# Patient Record
Sex: Male | Born: 1970 | Race: White | Hispanic: No | Marital: Single | Smoking: Current every day smoker
Health system: Southern US, Community
[De-identification: ages and names within clinical notes are randomized; demographics above are authoritative.]

## PROBLEM LIST (undated history)

## (undated) DIAGNOSIS — F17200 Nicotine dependence, unspecified, uncomplicated: Secondary | ICD-10-CM

## (undated) DIAGNOSIS — C4492 Squamous cell carcinoma of skin, unspecified: Secondary | ICD-10-CM

## (undated) DIAGNOSIS — F1020 Alcohol dependence, uncomplicated: Secondary | ICD-10-CM

## (undated) DIAGNOSIS — I2699 Other pulmonary embolism without acute cor pulmonale: Secondary | ICD-10-CM

## (undated) DIAGNOSIS — T148XXA Other injury of unspecified body region, initial encounter: Secondary | ICD-10-CM

## (undated) DIAGNOSIS — I4892 Unspecified atrial flutter: Secondary | ICD-10-CM

---

## 2015-11-05 DIAGNOSIS — C4492 Squamous cell carcinoma of skin, unspecified: Secondary | ICD-10-CM

## 2015-11-05 HISTORY — PX: SKIN BIOPSY: SHX1

## 2015-11-05 HISTORY — DX: Squamous cell carcinoma of skin, unspecified: C44.92

## 2016-05-20 ENCOUNTER — Emergency Department (HOSPITAL_COMMUNITY): Payer: BLUE CROSS/BLUE SHIELD

## 2016-05-20 ENCOUNTER — Encounter (HOSPITAL_COMMUNITY): Payer: Self-pay

## 2016-05-20 ENCOUNTER — Inpatient Hospital Stay (HOSPITAL_COMMUNITY)
Admission: EM | Admit: 2016-05-20 | Discharge: 2016-05-26 | DRG: 176 | Disposition: A | Discharge status: Home / Self Care | Payer: BLUE CROSS/BLUE SHIELD | Attending: Internal Medicine | Admitting: Internal Medicine

## 2016-05-20 DIAGNOSIS — A419 Sepsis, unspecified organism: Secondary | ICD-10-CM | POA: Diagnosis present

## 2016-05-20 DIAGNOSIS — B9789 Other viral agents as the cause of diseases classified elsewhere: Secondary | ICD-10-CM | POA: Diagnosis present

## 2016-05-20 DIAGNOSIS — E873 Alkalosis: Secondary | ICD-10-CM | POA: Diagnosis present

## 2016-05-20 DIAGNOSIS — R000 Tachycardia, unspecified: Secondary | ICD-10-CM | POA: Diagnosis not present

## 2016-05-20 DIAGNOSIS — I313 Pericardial effusion (noninflammatory): Secondary | ICD-10-CM

## 2016-05-20 DIAGNOSIS — I5042 Chronic combined systolic (congestive) and diastolic (congestive) heart failure: Secondary | ICD-10-CM | POA: Diagnosis present

## 2016-05-20 DIAGNOSIS — I2699 Other pulmonary embolism without acute cor pulmonale: Secondary | ICD-10-CM | POA: Diagnosis present

## 2016-05-20 DIAGNOSIS — I420 Dilated cardiomyopathy: Secondary | ICD-10-CM

## 2016-05-20 DIAGNOSIS — F1029 Alcohol dependence with unspecified alcohol-induced disorder: Secondary | ICD-10-CM | POA: Diagnosis not present

## 2016-05-20 DIAGNOSIS — I301 Infective pericarditis: Secondary | ICD-10-CM | POA: Diagnosis present

## 2016-05-20 DIAGNOSIS — F17200 Nicotine dependence, unspecified, uncomplicated: Secondary | ICD-10-CM | POA: Diagnosis present

## 2016-05-20 DIAGNOSIS — Z85828 Personal history of other malignant neoplasm of skin: Secondary | ICD-10-CM

## 2016-05-20 DIAGNOSIS — I483 Typical atrial flutter: Secondary | ICD-10-CM | POA: Diagnosis not present

## 2016-05-20 DIAGNOSIS — I4891 Unspecified atrial fibrillation: Secondary | ICD-10-CM | POA: Diagnosis present

## 2016-05-20 DIAGNOSIS — F1090 Alcohol use, unspecified, uncomplicated: Secondary | ICD-10-CM | POA: Diagnosis present

## 2016-05-20 DIAGNOSIS — I3139 Other pericardial effusion (noninflammatory): Secondary | ICD-10-CM | POA: Diagnosis present

## 2016-05-20 DIAGNOSIS — F1020 Alcohol dependence, uncomplicated: Secondary | ICD-10-CM | POA: Diagnosis present

## 2016-05-20 DIAGNOSIS — Z86711 Personal history of pulmonary embolism: Secondary | ICD-10-CM | POA: Diagnosis not present

## 2016-05-20 DIAGNOSIS — K5900 Constipation, unspecified: Secondary | ICD-10-CM | POA: Diagnosis present

## 2016-05-20 DIAGNOSIS — I4892 Unspecified atrial flutter: Secondary | ICD-10-CM | POA: Diagnosis present

## 2016-05-20 DIAGNOSIS — Z8249 Family history of ischemic heart disease and other diseases of the circulatory system: Secondary | ICD-10-CM | POA: Diagnosis not present

## 2016-05-20 DIAGNOSIS — I319 Disease of pericardium, unspecified: Secondary | ICD-10-CM | POA: Diagnosis not present

## 2016-05-20 DIAGNOSIS — I2609 Other pulmonary embolism with acute cor pulmonale: Secondary | ICD-10-CM | POA: Diagnosis not present

## 2016-05-20 HISTORY — DX: Alcohol dependence, uncomplicated: F10.20

## 2016-05-20 HISTORY — DX: Unspecified atrial flutter: I48.92

## 2016-05-20 HISTORY — DX: Nicotine dependence, unspecified, uncomplicated: F17.200

## 2016-05-20 HISTORY — DX: Other injury of unspecified body region, initial encounter: T14.8XXA

## 2016-05-20 HISTORY — DX: Squamous cell carcinoma of skin, unspecified: C44.92

## 2016-05-20 LAB — CBC WITH DIFFERENTIAL/PLATELET
BASOS ABS: 0 10*3/uL (ref 0.0–0.1)
Basophils Relative: 0 %
EOS ABS: 0.2 10*3/uL (ref 0.0–0.7)
Eosinophils Relative: 1 %
HCT: 39.5 % (ref 39.0–52.0)
Hemoglobin: 13.1 g/dL (ref 13.0–17.0)
LYMPHS ABS: 2 10*3/uL (ref 0.7–4.0)
Lymphocytes Relative: 10 %
MCH: 30.1 pg (ref 26.0–34.0)
MCHC: 33.2 g/dL (ref 30.0–36.0)
MCV: 90.8 fL (ref 78.0–100.0)
MONO ABS: 2 10*3/uL — AB (ref 0.1–1.0)
Monocytes Relative: 10 %
Neutro Abs: 16.2 10*3/uL — ABNORMAL HIGH (ref 1.7–7.7)
Neutrophils Relative %: 79 %
PLATELETS: 390 10*3/uL (ref 150–400)
RBC: 4.35 MIL/uL (ref 4.22–5.81)
RDW: 14.2 % (ref 11.5–15.5)
WBC: 20.4 10*3/uL — AB (ref 4.0–10.5)

## 2016-05-20 LAB — COMPREHENSIVE METABOLIC PANEL
ALBUMIN: 3.2 g/dL — AB (ref 3.5–5.0)
ALT: 25 U/L (ref 17–63)
AST: 37 U/L (ref 15–41)
Alkaline Phosphatase: 108 U/L (ref 38–126)
Anion gap: 10 (ref 5–15)
BUN: 14 mg/dL (ref 6–20)
CHLORIDE: 102 mmol/L (ref 101–111)
CO2: 23 mmol/L (ref 22–32)
Calcium: 8.8 mg/dL — ABNORMAL LOW (ref 8.9–10.3)
Creatinine, Ser: 0.97 mg/dL (ref 0.61–1.24)
GFR calc Af Amer: >60 mL/min (ref >60)
GLUCOSE: 138 mg/dL — AB (ref 65–99)
POTASSIUM: 3.9 mmol/L (ref 3.5–5.1)
Sodium: 135 mmol/L (ref 135–145)
Total Bilirubin: 0.7 mg/dL (ref 0.3–1.2)
Total Protein: 6.8 g/dL (ref 6.5–8.1)

## 2016-05-20 LAB — LACTIC ACID, PLASMA: LACTIC ACID, VENOUS: 1.1 mmol/L (ref 0.5–1.9)

## 2016-05-20 LAB — I-STAT CG4 LACTIC ACID, ED: Lactic Acid, Venous: 2.42 mmol/L (ref 0.5–1.9)

## 2016-05-20 LAB — APTT: aPTT: 51 seconds — ABNORMAL HIGH (ref 24–36)

## 2016-05-20 LAB — PROTIME-INR
INR: 1.4
Prothrombin Time: 17.2 seconds — ABNORMAL HIGH (ref 11.4–15.2)

## 2016-05-20 LAB — BLOOD GAS, ARTERIAL
Acid-base deficit: 2.7 mmol/L — ABNORMAL HIGH (ref 0.0–2.0)
Bicarbonate: 20.6 mmol/L (ref 20.0–28.0)
Drawn by: 42624
O2 Content: 2 L/min
O2 Saturation: 95.2 %
PCO2 ART: 29.2 mmHg — AB (ref 32.0–48.0)
PH ART: 7.461 — AB (ref 7.350–7.450)
Patient temperature: 98.6
pO2, Arterial: 78.9 mmHg — ABNORMAL LOW (ref 83.0–108.0)

## 2016-05-20 LAB — CBG MONITORING, ED: Glucose-Capillary: 154 mg/dL — ABNORMAL HIGH (ref 65–99)

## 2016-05-20 LAB — BRAIN NATRIURETIC PEPTIDE: B NATRIURETIC PEPTIDE 5: 104.7 pg/mL — AB (ref 0.0–100.0)

## 2016-05-20 LAB — ETHANOL

## 2016-05-20 LAB — PROCALCITONIN: PROCALCITONIN: 0.21 ng/mL

## 2016-05-20 LAB — T4, FREE: Free T4: 1.02 ng/dL (ref 0.61–1.12)

## 2016-05-20 LAB — MRSA PCR SCREENING: MRSA by PCR: NEGATIVE

## 2016-05-20 LAB — I-STAT TROPONIN, ED: TROPONIN I, POC: 0.01 ng/mL (ref 0.00–0.08)

## 2016-05-20 LAB — TSH: TSH: 2.007 u[IU]/mL (ref 0.350–4.500)

## 2016-05-20 LAB — HEPARIN LEVEL (UNFRACTIONATED): Heparin Unfractionated: <0.1 IU/mL — ABNORMAL LOW (ref 0.30–0.70)

## 2016-05-20 MED ORDER — FENTANYL CITRATE (PF) 100 MCG/2ML IJ SOLN
50.0000 ug | Freq: Once | INTRAMUSCULAR | Status: AC
  Administered 2016-05-20: 50 ug via INTRAVENOUS
  Filled 2016-05-20: qty 2

## 2016-05-20 MED ORDER — LORAZEPAM 2 MG/ML IJ SOLN
0.0000 mg | Freq: Four times a day (QID) | INTRAMUSCULAR | Status: DC
  Administered 2016-05-20: 1 mg via INTRAVENOUS
  Filled 2016-05-20: qty 1

## 2016-05-20 MED ORDER — HEPARIN (PORCINE) IN NACL 100-0.45 UNIT/ML-% IJ SOLN
2500.0000 [IU]/h | INTRAMUSCULAR | Status: AC
  Administered 2016-05-20: 1200 [IU]/h via INTRAVENOUS
  Administered 2016-05-21: 1500 [IU]/h via INTRAVENOUS
  Administered 2016-05-21: 2000 [IU]/h via INTRAVENOUS
  Administered 2016-05-22: 2250 [IU]/h via INTRAVENOUS
  Administered 2016-05-22: 2300 [IU]/h via INTRAVENOUS
  Administered 2016-05-23: 2450 [IU]/h via INTRAVENOUS
  Administered 2016-05-23 – 2016-05-25 (×5): 2500 [IU]/h via INTRAVENOUS
  Filled 2016-05-20 (×11): qty 250

## 2016-05-20 MED ORDER — VANCOMYCIN HCL IN DEXTROSE 1-5 GM/200ML-% IV SOLN
1000.0000 mg | Freq: Once | INTRAVENOUS | Status: AC
  Administered 2016-05-20: 1000 mg via INTRAVENOUS
  Filled 2016-05-20: qty 200

## 2016-05-20 MED ORDER — VITAMIN B-1 100 MG PO TABS
100.0000 mg | ORAL_TABLET | Freq: Every day | ORAL | Status: DC
  Administered 2016-05-20 – 2016-05-25 (×5): 100 mg via ORAL
  Filled 2016-05-20 (×5): qty 1

## 2016-05-20 MED ORDER — SODIUM CHLORIDE 0.9 % IV BOLUS (SEPSIS)
1000.0000 mL | Freq: Once | INTRAVENOUS | Status: AC
  Administered 2016-05-20: 1000 mL via INTRAVENOUS

## 2016-05-20 MED ORDER — ONDANSETRON HCL 4 MG/2ML IJ SOLN: 4.0000 mg | Freq: Four times a day (QID) | INTRAMUSCULAR | Status: DC | PRN

## 2016-05-20 MED ORDER — LORAZEPAM 1 MG PO TABS
1.0000 mg | ORAL_TABLET | Freq: Four times a day (QID) | ORAL | Status: AC | PRN
  Administered 2016-05-22: 1 mg via ORAL
  Filled 2016-05-20: qty 1

## 2016-05-20 MED ORDER — PIPERACILLIN-TAZOBACTAM 3.375 G IVPB 30 MIN: 3.3750 g | Freq: Once | INTRAVENOUS | Status: DC

## 2016-05-20 MED ORDER — ADULT MULTIVITAMIN W/MINERALS CH
1.0000 | ORAL_TABLET | Freq: Every day | ORAL | Status: DC
  Administered 2016-05-21 – 2016-05-25 (×5): 1 via ORAL
  Filled 2016-05-20 (×5): qty 1

## 2016-05-20 MED ORDER — LORAZEPAM 2 MG/ML IJ SOLN: 0.0000 mg | Freq: Two times a day (BID) | INTRAMUSCULAR | Status: DC

## 2016-05-20 MED ORDER — PIPERACILLIN-TAZOBACTAM 3.375 G IVPB
3.3750 g | Freq: Three times a day (TID) | INTRAVENOUS | Status: DC
  Administered 2016-05-20 – 2016-05-23 (×8): 3.375 g via INTRAVENOUS
  Filled 2016-05-20 (×9): qty 50

## 2016-05-20 MED ORDER — ORAL CARE MOUTH RINSE
15.0000 mL | Freq: Two times a day (BID) | OROMUCOSAL | Status: DC
  Administered 2016-05-21 – 2016-05-26 (×7): 15 mL via OROMUCOSAL

## 2016-05-20 MED ORDER — VANCOMYCIN HCL IN DEXTROSE 1-5 GM/200ML-% IV SOLN: 1000.0000 mg | Freq: Once | INTRAVENOUS | Status: DC

## 2016-05-20 MED ORDER — VANCOMYCIN HCL IN DEXTROSE 1-5 GM/200ML-% IV SOLN
1000.0000 mg | Freq: Three times a day (TID) | INTRAVENOUS | Status: DC
  Administered 2016-05-20 – 2016-05-22 (×5): 1000 mg via INTRAVENOUS
  Filled 2016-05-20 (×6): qty 200

## 2016-05-20 MED ORDER — MORPHINE SULFATE (PF) 4 MG/ML IV SOLN
2.0000 mg | INTRAVENOUS | Status: DC | PRN
  Filled 2016-05-20: qty 1

## 2016-05-20 MED ORDER — THIAMINE HCL 100 MG/ML IJ SOLN
100.0000 mg | Freq: Every day | INTRAMUSCULAR | Status: DC
  Administered 2016-05-21: 100 mg via INTRAVENOUS
  Filled 2016-05-20 (×2): qty 2

## 2016-05-20 MED ORDER — ONDANSETRON HCL 4 MG PO TABS: 4.0000 mg | ORAL_TABLET | Freq: Four times a day (QID) | ORAL | Status: DC | PRN

## 2016-05-20 MED ORDER — PIPERACILLIN-TAZOBACTAM 3.375 G IVPB 30 MIN
3.3750 g | Freq: Once | INTRAVENOUS | Status: AC
  Administered 2016-05-20: 3.375 g via INTRAVENOUS
  Filled 2016-05-20: qty 50

## 2016-05-20 MED ORDER — SODIUM CHLORIDE 0.9 % IV BOLUS (SEPSIS)
2000.0000 mL | Freq: Once | INTRAVENOUS | Status: AC
  Administered 2016-05-20: 2000 mL via INTRAVENOUS

## 2016-05-20 MED ORDER — FOLIC ACID 1 MG PO TABS
1.0000 mg | ORAL_TABLET | Freq: Every day | ORAL | Status: DC
  Administered 2016-05-20 – 2016-05-25 (×6): 1 mg via ORAL
  Filled 2016-05-20 (×6): qty 1

## 2016-05-20 MED ORDER — LORAZEPAM 2 MG/ML IJ SOLN: 1.0000 mg | Freq: Four times a day (QID) | INTRAMUSCULAR | Status: AC | PRN

## 2016-05-20 MED ORDER — SODIUM CHLORIDE 0.9 % IV SOLN
INTRAVENOUS | Status: AC
  Administered 2016-05-20: 21:00:00 via INTRAVENOUS

## 2016-05-20 MED ORDER — LORAZEPAM 2 MG/ML IJ SOLN
0.5000 mg | Freq: Once | INTRAMUSCULAR | Status: AC
  Administered 2016-05-20: 0.5 mg via INTRAVENOUS
  Filled 2016-05-20: qty 1

## 2016-05-20 MED ORDER — IOPAMIDOL (ISOVUE-370) INJECTION 76%
INTRAVENOUS | Status: AC
  Administered 2016-05-20: 70 mL
  Filled 2016-05-20: qty 100

## 2016-05-20 MED ORDER — HEPARIN BOLUS VIA INFUSION
4000.0000 [IU] | Freq: Once | INTRAVENOUS | Status: AC
  Administered 2016-05-20: 4000 [IU] via INTRAVENOUS
  Filled 2016-05-20: qty 4000

## 2016-05-20 MED ORDER — SODIUM CHLORIDE 0.9% FLUSH
3.0000 mL | Freq: Two times a day (BID) | INTRAVENOUS | Status: DC
  Administered 2016-05-21 – 2016-05-26 (×4): 3 mL via INTRAVENOUS

## 2016-05-20 MED ORDER — ACETAMINOPHEN 500 MG PO TABS
1000.0000 mg | ORAL_TABLET | Freq: Four times a day (QID) | ORAL | Status: DC | PRN
  Administered 2016-05-20: 1000 mg via ORAL
  Filled 2016-05-20: qty 2

## 2016-05-20 NOTE — H&P (Signed)
History and Physical    Carl Bush [id number] DOB: 17-Feb-1971 DOA: 05/20/2016  PCP: [facility] - first appointment scheduled for tomorrow Consultants:  Dermatology - Melina Copa Patient coming from: home - lives alone; [place]: mother, [phone]  Chief Complaint: chest pain, SOB  HPI: Carl Bush is a 45 y.o. male with no significant past medical history presenting with chest pain, SOB.    Since September, he has noticed upper chest pain with any exertion.  Light-headed.  SOB.  Occasional leg spasms.  Pain in upper chest and down below rib cage.  Uncertain what might have brought it on - initially attributed it to the rabies series, which he finished on 7/28 after having been bitten by a rabid raccoon.  Pain is constant, SOB is also constant.  Last night, his legs spasmed and he got dizzy, light-headed, chest pain, SOB - had to drop onto the kitchen floor to recover. No weight changes.  Fever to 100.5 in ER, thinks he also has been febrile at home for especially the last 2 nights.  Slight cough, nonproductive.  +rhinorrhea typical for this time of year.   Thinks he has a problem with ETOH.  20+ year history, drinks 12 pack QOD.  Longest period of sobriety 6 months.  No h/o withdrawal/DTs.  Does not desire to quit drinking.   ED Course: Per Dr. Vanita Panda: Patient account of new atrial fibrillation versus atrial flutter with persistent heart rate in the 120/160 range.  I discussed the patient's case with our critical care colleagues given concern for new A. fib, as well as pulmonary embolism, with some evidence for concurrent infection.  Patient is mentating appropriately, and in spite of his persistent tachycardia, does not need emergent airway, he is deemed appropriate for a stepdown bed.  I discussed the patient's findings thus far with him, including PE, pericardial effusion, concern for infection, new arrhythmia.  Patient has received broad-spectrum in a box, will receive heparin  drip.  Patient awaiting admission to stepdown bed.   Review of Systems: As per HPI; otherwise 10 point review of systems reviewed and negative.   Ambulatory Status:  Dizzy with standing, pain starts, difficulty ambulating  Past Medical History:  Diagnosis Date  . Alcohol dependence ([place])   . 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 Tobacco dependence     Past Surgical History:  Procedure Laterality Date  . SKIN BIOPSY  11/2015    Social History   Social History  . Marital status: Single    Spouse name: N/A  . Number of children: N/A  . Years of education: N/A   Occupational History  . landlord    Social History Main Topics  . Smoking status: Current Every Day Smoker    Packs/day: 1.00    Years: 24.00  . Smokeless tobacco: Never Used  . Alcohol use 25.2 oz/week    42 Cans of beer per week     Comment: 12 pack every other day  . Drug use:     Types: Marijuana     Comment: daily marijuana  . Sexual activity: Not on file   Other Topics Concern  . Not on file   Social History Narrative  . No narrative on file    No Known Allergies  History reviewed. No pertinent family history.  Prior to Admission medications   Medication Sig Start Date End Date Taking? Authorizing Provider  ibuprofen (ADVIL,MOTRIN) 200 MG tablet Take 200-400 mg by mouth every 6 (six)  hours as needed for headache (or pain).   Yes Historical Provider, MD    Physical Exam: Vitals:   05/20/16 2315 05/20/16 2330 05/21/16 0000 05/21/16 0100  BP: 120/84 (!) 128/93 97/74 113/79  Pulse: (!) 130 (!) 130 (!) 130 (!) 128  Resp: (!) 32 (!) 29 (!) 27 (!) 30  Temp:  (!) 100.9 F (38.3 C)    TempSrc:  Oral    SpO2: 97% 98% 97% 95%  Weight:      Height:         General:  Appears ill, +tachypnea with mildly increased WOB Eyes:  PERRL, EOMI, normal lids, iris ENT:  grossly normal hearing, lips & tongue, mmm Neck:  no LAD, masses or thyromegaly Cardiovascular:  Tachycardia  no m/r/g. No  LE edema.  Respiratory:  CTA bilaterally, no w/r/r. Increased WOB, tachypnea. Abdomen:  soft, ntnd, NABS Skin: no rash or induration seen on limited exam Musculoskeletal:  grossly normal tone BUE/BLE, good ROM, no bony abnormality Psychiatric:  grossly normal mood and affect, speech fluent and appropriate, AOx3 Neurologic:  CN 2-12 grossly intact, moves all extremities in coordinated fashion, sensation intact  Labs on Admission: I have personally reviewed following labs and imaging studies  CBC:  Recent Labs Lab 05/20/16 1450  WBC 20.4*  NEUTROABS 16.2*  HGB 13.1  HCT 39.5  MCV 90.8  PLT [id number]   Basic Metabolic Panel:  Recent Labs Lab 05/20/16 1450  NA 135  K 3.9  CL 102  CO2 23  GLUCOSE 138*  BUN 14  CREATININE 0.97  CALCIUM 8.8*   GFR: Estimated Creatinine Clearance: 105.3 mL/min (by C-G formula based on SCr of 0.97 mg/dL). Liver Function Tests:  Recent Labs Lab 05/20/16 1450  AST 37  ALT 25  ALKPHOS 108  BILITOT 0.7  PROT 6.8  ALBUMIN 3.2*   No results for input(s): LIPASE, AMYLASE in the last 168 hours. No results for input(s): AMMONIA in the last 168 hours. Coagulation Profile:  Recent Labs Lab 05/20/16 2100  INR 1.40   Cardiac Enzymes: No results for input(s): CKTOTAL, CKMB, CKMBINDEX, TROPONINI in the last 168 hours. BNP (last 3 results) No results for input(s): PROBNP in the last 8760 hours. HbA1C: No results for input(s): HGBA1C in the last 72 hours. CBG:  Recent Labs Lab 05/20/16 1423  GLUCAP 154*   Lipid Profile: No results for input(s): CHOL, HDL, LDLCALC, TRIG, CHOLHDL, LDLDIRECT in the last 72 hours. Thyroid Function Tests:  Recent Labs  05/20/16 1450  TSH 2.007  FREET4 1.02   Anemia Panel: No results for input(s): VITAMINB12, FOLATE, FERRITIN, TIBC, IRON, RETICCTPCT in the last 72 hours. Urine analysis: No results found for: COLORURINE, APPEARANCEUR, LABSPEC, PHURINE, GLUCOSEU, HGBUR, BILIRUBINUR, KETONESUR, PROTEINUR,  UROBILINOGEN, NITRITE, LEUKOCYTESUR  Creatinine Clearance: Estimated Creatinine Clearance: 105.3 mL/min (by C-G formula based on SCr of 0.97 mg/dL).  Sepsis Labs: @LABRCNTIP (procalcitonin:4,lacticidven:4) ) Recent Results (from the past 240 hour(s))  MRSA PCR Screening     Status: None   Collection Time: 05/20/16  8:44 PM  Result Value Ref Range Status   MRSA by PCR NEGATIVE NEGATIVE Final    Comment:        The GeneXpert MRSA Assay (FDA approved for NASAL specimens only), is one component of a comprehensive MRSA colonization surveillance program. It is not intended to diagnose MRSA infection nor to guide or monitor treatment for MRSA infections.      Radiological Exams on Admission: Ct Angio Chest Pe W Or Wo Contrast  Result Date: 05/20/2016 CLINICAL DATA:  45 y/o M; upper chest pain and shortness of breath. History of smoking. EXAM: CT ANGIOGRAPHY CHEST WITH CONTRAST TECHNIQUE: Multidetector CT imaging of the chest was performed using the standard protocol during bolus administration of intravenous contrast. Multiplanar CT image reconstructions and MIPs were obtained to evaluate the vascular anatomy. CONTRAST:  70 cc Isovue 370 COMPARISON:  None. FINDINGS: Cardiovascular: Satisfactory opacification of pulmonary arteries to the segmental level. Multiple segmental pulmonary arteries of the left lung base are occluded consistent with pulmonary embolus. No evidence for right heart strain. Normal heart size. Moderate pericardial effusion. Mild coronary artery calcifications. Normal caliber main pulmonary artery. Normal caliber thoracic aorta. Mediastinum/Nodes: Prominent subcentimeter mediastinal lymph nodes are likely reactive. Thyroid gland, trachea, and esophagus demonstrate no significant findings. Lungs/Pleura: Mild centrilobular emphysema. Ill-defined ground-glass opacity at the left lung base in region of multiple occluded segmental pulmonary emboli. Small left-sided pleural  effusion. Upper Abdomen: Gastrohepatic ligament lymphadenopathy of uncertain significance. Possible periportal edema. Musculoskeletal: No chest wall abnormality. No acute or significant osseous findings. Review of the MIP images confirms the above findings. IMPRESSION: 1. Left lower lobe segmental pulmonary emboli. 2. Ground-glass opacity in the left lower lobe in region of pulmonary emboli is probably a developing pulmonary infarct, less likely pneumonia. 3. Small left-sided pleural effusion. 4. Moderate pericardial effusion. 5. Gastrohepatic ligament lymphadenopathy and possible periportal edema in the liver of uncertain significance, partially visualized. These results were called by telephone at the time of interpretation on 05/20/2016 at 4:31 pm to Dr. Vanita Panda, who verbally acknowledged these results. Electronically Signed   By: Kristine Garbe M.D.   On: 05/20/2016 16:35   Dg Chest Port 1 View  Result Date: 05/20/2016 CLINICAL DATA:  Increasing chest pain and shortness of breath, fluttering in chest over last couple weeks, smoker EXAM: PORTABLE CHEST 1 VIEW COMPARISON:  Portable exam 1501 hours without priors for comparison. FINDINGS: Minimal enlargement of cardiac silhouette. Mediastinal contours and pulmonary vascularity normal. Suspicion of retrocardiac LEFT lower lobe infiltrate with loss of the LEFT diaphragmatic silhouette. Remaining lungs clear. No pleural effusion or pneumothorax. Bones unremarkable. IMPRESSION: Minimal enlargement of cardiac silhouette. Question LEFT lower lobe consolidation/pneumonia. Electronically Signed   By: Lavonia Dana M.D.   On: 05/20/2016 15:19    EKG: Independently reviewed.  Sinus tachycardia vs. Atrial flutter with rate 132; nonspecific ST changes  Assessment/Plan Principal Problem:   Pulmonary embolism (HCC) Active Problems:   Sepsis ([place])   Tobacco use disorder   Alcohol dependence ([place])   Pulmonary embolism -Patient's symptoms may all come  from this issue - has PE with apparent necrosis, moderate effusion -PCCM consulted and suggested hospitalist admission to SDU -ABG checked to ensure adequate ventilation/oxygenation; does not require intubation at this time -Heparin drip per pharmacy -Morphine as needed for pain -Likely to need cardiology consult in AM for pericardial effusion as well as possible atrial flutter - but for now hemodynamically stable and tachycardia may improve with hydration and treatment.  Sepsis -While his fever, tachycardia, tachypnea, elevated WBC count, elevated lactate are likely all related to PE, these would also describe sepsis physiology -Sepsis protocol initiated -Blood and urine cultures pending -Will admit to SDU with telemetry and continue to monitor -Treat with IV Vanc and Zosyn, pharmacy to dose -Will add HIV -Will trend lactate to ensure improvement  Tobacco dependence -Encourage cessation.  This was discussed with the patient and should be reviewed on an ongoing basis.   -Patch ordered at patient request.  Alcohol dependence -Acknowledges problem but does not desire to quit at this time. -Will place on CIWA protocol. -Ongoing counseling is encouraged.  DVT prophylaxis: Heparin drip Code Status:  Full - confirmed with patient Family Communication: None present  Disposition Plan: Home once clinically improved Consults called: PCCM (by ER)  Admission status: Admit - It is my clinical opinion that admission to INPATIENT is reasonable and necessary because this patient will require at least 2 midnights in the hospital to treat this condition based on the medical complexity of the problems presented.  Given the aforementioned information, the predictability of an adverse outcome is felt to be significant.    Karmen Bongo MD [facility]  If 7PM-7AM, please contact night-coverage [email or web address] Password [email or web address]  05/21/2016, 1:12 AM

## 2016-05-20 NOTE — ED Notes (Signed)
Pt's CBG 154.  Informed Loma Sousa, RN and Rachel Bo, Therapist, sports.

## 2016-05-20 NOTE — Progress Notes (Signed)
ANTICOAGULATION CONSULT NOTE - Initial Consult  Pharmacy Consult for heparin Indication: pulmonary embolus  No Known Allergies  Patient Measurements: Height: 6\' 2"  (188 cm) (per pt report) Weight: 167 lb (75.8 kg) (per pt report) IBW/kg (Calculated) : 82.2 Heparin Dosing Weight: 75.8 kg  Vital Signs: Temp: 100.5 F (38.1 C) (11/14 1426) Temp Source: Oral (11/14 1426) BP: 98/82 (11/14 1700) Pulse Rate: 149 (11/14 1700)  Labs:  Recent Labs  05/20/16 1450  HGB 13.1  HCT 39.5  PLT 390  CREATININE 0.97    Estimated Creatinine Clearance: 105.3 mL/min (by C-G formula based on SCr of 0.97 mg/dL).   Medical History: History reviewed. No pertinent past medical history.  Assessment: 45 yo M in ED with PE by CT scan.  Pharmacy consulted to dose heparin. 11/14 CT: left lower lobe segmental PE, ground-glass opacity on LL lobe in region of PE is probably a developing PE, less likely PNA. Moderate pericardial effusion, gastrohepatic ligament lymphadenopathy and possible periportal edema in liver. He reports his height is 6 ft 2 inches and his weight is 167 pounds. Creatinine WNL and CBC WNL.    Goal of Therapy:  Heparin level 0.3-0.7 units/ml Monitor platelets by anticoagulation protocol: Yes   Plan: heparin 4000 unit bolus Heparin drip at 1200 units/hr Check 6 hr heparin level Daily heparin level and CBC while on heparin F/u for oral anticoagulation  Eudelia Bunch, Pharm.D. BP:[phone] 05/20/2016 5:26 PM

## 2016-05-20 NOTE — ED Provider Notes (Signed)
[facility] DEPT Provider Note   CSN: [id number] Arrival date & time: 05/20/16  1414     History   Chief Complaint Chief Complaint  Patient presents with  . Tachycardia    HPI Carl Bush is a 45 y.o. male.  HPI Patient presents with shortness of breath and chest pain that started today. States he's had increasing dyspnea with exertion and fatigue. Has had mild cough without sputum production. Also admits to palpitations. No new lower extremity swelling but does have bilateral lower extremity cramps that started last night. Patient states he's had episodic chest pain and shortness of breath for the past 2 months. Smokes daily for the past 20 years. No history of cardiac disease or close family relatives with coronary artery disease. Noted to be tachycardic with heart rate in the 190s to 260s by EMS. Given 18 mg of adenosine and 30 mg of Cardizem with some improvement of his tachycardia. Denies any illegal drug usage including cocaine and PCP. Denies excessive stimulant use or caffeine intake. Admits occasional marijuana. States he drinks alcohol frequently and last drank 4 days ago. No history of withdrawal symptoms. Past Medical History:  Diagnosis Date  . Alcohol dependence ([place])   . 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 Tobacco dependence     Patient Active Problem List   Diagnosis Date Noted  . Sepsis ([place]) 05/21/2016  . Tobacco use disorder 05/21/2016  . Alcohol dependence ([place]) 05/21/2016  . Pulmonary embolism ([place]) 05/20/2016    Past Surgical History:  Procedure Laterality Date  . SKIN BIOPSY  11/2015       Home Medications    Prior to Admission medications   Medication Sig Start Date End Date Taking? Authorizing Provider  ibuprofen (ADVIL,MOTRIN) 200 MG tablet Take 200-400 mg by mouth every 6 (six) hours as needed for headache (or pain).   Yes Historical Provider, MD    Family History History reviewed. No pertinent family  history.  Social History Social History  Substance Use Topics  . Smoking status: Current Every Day Smoker    Packs/day: 1.00    Years: 24.00  . Smokeless tobacco: Never Used  . Alcohol use 25.2 oz/week    42 Cans of beer per week     Comment: 12 pack every other day     Allergies   Patient has no known allergies.   Review of Systems Review of Systems  Constitutional: Positive for chills, fatigue and fever.  HENT: Negative for congestion.   Respiratory: Positive for cough and shortness of breath.   Cardiovascular: Positive for chest pain and palpitations. Negative for leg swelling.  Gastrointestinal: Negative for abdominal pain, constipation, diarrhea, nausea and vomiting.  Genitourinary: Negative for dysuria, flank pain and frequency.  Musculoskeletal: Negative for back pain, myalgias, neck pain and neck stiffness.  Skin: Negative for rash and wound.  Neurological: Negative for dizziness, tremors, weakness, light-headedness, numbness and headaches.     Physical Exam Updated Vital Signs BP 115/90   Pulse (!) 123   Temp 98.5 F (36.9 C) (Oral)   Resp (!) 25   Ht 6\' 2"  (1.88 m) Comment: per pt report  Wt 167 lb (75.8 kg) Comment: per pt report  SpO2 97%   BMI 21.44 kg/m   Physical Exam   ED Treatments / Results  Labs (all labs ordered are listed, but only abnormal results are displayed) Labs Reviewed  CBC WITH DIFFERENTIAL/PLATELET - Abnormal; Notable for the following:  Result Value   WBC 20.4 (*)    Neutro Abs 16.2 (*)    Monocytes Absolute 2.0 (*)    All other components within normal limits  COMPREHENSIVE METABOLIC PANEL - Abnormal; Notable for the following:    Glucose, Bld 138 (*)    Calcium 8.8 (*)    Albumin 3.2 (*)    All other components within normal limits  BRAIN NATRIURETIC PEPTIDE - Abnormal; Notable for the following:    B Natriuretic Peptide 104.7 (*)    All other components within normal limits  HEPARIN LEVEL (UNFRACTIONATED) -  Abnormal; Notable for the following:    Heparin Unfractionated <0.10 (*)    All other components within normal limits  PROTIME-INR - Abnormal; Notable for the following:    Prothrombin Time 17.2 (*)    All other components within normal limits  APTT - Abnormal; Notable for the following:    aPTT 51 (*)    All other components within normal limits  BLOOD GAS, ARTERIAL - Abnormal; Notable for the following:    pH, Arterial 7.461 (*)    pCO2 arterial 29.2 (*)    pO2, Arterial 78.9 (*)    Acid-base deficit 2.7 (*)    All other components within normal limits  URINALYSIS, ROUTINE W REFLEX MICROSCOPIC (NOT AT [facility]) - Abnormal; Notable for the following:    Color, Urine AMBER (*)    Specific Gravity, Urine >1.046 (*)    Hgb urine dipstick MODERATE (*)    Protein, ur 30 (*)    All other components within normal limits  RAPID URINE DRUG SCREEN, HOSP PERFORMED - Abnormal; Notable for the following:    Opiates POSITIVE (*)    Tetrahydrocannabinol POSITIVE (*)    All other components within normal limits  URINE MICROSCOPIC-ADD ON - Abnormal; Notable for the following:    Squamous Epithelial / LPF 0-5 (*)    Bacteria, UA FEW (*)    Casts HYALINE CASTS (*)    All other components within normal limits  CBG MONITORING, ED - Abnormal; Notable for the following:    Glucose-Capillary 154 (*)    All other components within normal limits  I-STAT CG4 LACTIC ACID, ED - Abnormal; Notable for the following:    Lactic Acid, Venous 2.42 (*)    All other components within normal limits  MRSA PCR SCREENING  CULTURE, BLOOD (ROUTINE X 2)  CULTURE, BLOOD (ROUTINE X 2)  ETHANOL  TSH  T4, FREE  LACTIC ACID, PLASMA  LACTIC ACID, PLASMA  PROCALCITONIN  BASIC METABOLIC PANEL  HEPARIN LEVEL (UNFRACTIONATED)  CBC  HIV ANTIBODY (ROUTINE TESTING)  I-STAT TROPOININ, ED    EKG  EKG Interpretation  Date/Time:  Tuesday May 20 2016 14:19:08 EST Ventricular Rate:  139 PR Interval:    QRS  Duration: 151 QT Interval:  359 QTC Calculation: 546 R Axis:   -120 Text Interpretation:  Sinus tachycardia IVCD, consider atypical RBBB Probable inferior infarct, recent Artifact in lead(s) I III aVL V5 Confirmed by Lita Mains  MD, Ciella Obi ([id number]) on 05/20/2016 3:34:56 PM       Radiology Ct Angio Chest Pe W Or Wo Contrast  Result Date: 05/20/2016 CLINICAL DATA:  45 y/o M; upper chest pain and shortness of breath. History of smoking. EXAM: CT ANGIOGRAPHY CHEST WITH CONTRAST TECHNIQUE: Multidetector CT imaging of the chest was performed using the standard protocol during bolus administration of intravenous contrast. Multiplanar CT image reconstructions and MIPs were obtained to evaluate the vascular anatomy. CONTRAST:  70  cc Isovue 370 COMPARISON:  None. FINDINGS: Cardiovascular: Satisfactory opacification of pulmonary arteries to the segmental level. Multiple segmental pulmonary arteries of the left lung base are occluded consistent with pulmonary embolus. No evidence for right heart strain. Normal heart size. Moderate pericardial effusion. Mild coronary artery calcifications. Normal caliber main pulmonary artery. Normal caliber thoracic aorta. Mediastinum/Nodes: Prominent subcentimeter mediastinal lymph nodes are likely reactive. Thyroid gland, trachea, and esophagus demonstrate no significant findings. Lungs/Pleura: Mild centrilobular emphysema. Ill-defined ground-glass opacity at the left lung base in region of multiple occluded segmental pulmonary emboli. Small left-sided pleural effusion. Upper Abdomen: Gastrohepatic ligament lymphadenopathy of uncertain significance. Possible periportal edema. Musculoskeletal: No chest wall abnormality. No acute or significant osseous findings. Review of the MIP images confirms the above findings. IMPRESSION: 1. Left lower lobe segmental pulmonary emboli. 2. Ground-glass opacity in the left lower lobe in region of pulmonary emboli is probably a developing pulmonary  infarct, less likely pneumonia. 3. Small left-sided pleural effusion. 4. Moderate pericardial effusion. 5. Gastrohepatic ligament lymphadenopathy and possible periportal edema in the liver of uncertain significance, partially visualized. These results were called by telephone at the time of interpretation on 05/20/2016 at 4:31 pm to Dr. Vanita Panda, who verbally acknowledged these results. Electronically Signed   By: Kristine Garbe M.D.   On: 05/20/2016 16:35   Dg Chest Port 1 View  Result Date: 05/20/2016 CLINICAL DATA:  Increasing chest pain and shortness of breath, fluttering in chest over last couple weeks, smoker EXAM: PORTABLE CHEST 1 VIEW COMPARISON:  Portable exam 1501 hours without priors for comparison. FINDINGS: Minimal enlargement of cardiac silhouette. Mediastinal contours and pulmonary vascularity normal. Suspicion of retrocardiac LEFT lower lobe infiltrate with loss of the LEFT diaphragmatic silhouette. Remaining lungs clear. No pleural effusion or pneumothorax. Bones unremarkable. IMPRESSION: Minimal enlargement of cardiac silhouette. Question LEFT lower lobe consolidation/pneumonia. Electronically Signed   By: Lavonia Dana M.D.   On: 05/20/2016 15:19    Procedures Procedures (including critical care time)  Medications Ordered in ED Medications  heparin ADULT infusion 100 units/mL (25000 units/218mL sodium chloride 0.45%) (1,500 Units/hr Intravenous New Bag/Given 05/21/16 0656)  0.9 %  sodium chloride infusion ( Intravenous Rate/Dose Verify 05/21/16 0600)  acetaminophen (TYLENOL) tablet 1,000 mg (1,000 mg Oral Given 05/20/16 2018)  morphine 4 MG/ML injection 2 mg (not administered)  LORazepam (ATIVAN) tablet 1 mg (not administered)    Or  LORazepam (ATIVAN) injection 1 mg (not administered)  thiamine (VITAMIN B-1) tablet 100 mg (100 mg Oral Given 05/20/16 2134)    Or  thiamine (B-1) injection 100 mg ( Intravenous See Alternative [id number])  folic acid (FOLVITE) tablet  1 mg (1 mg Oral Given 05/20/16 2135)  multivitamin with minerals tablet 1 tablet (not administered)  ondansetron (ZOFRAN) tablet 4 mg (not administered)    Or  ondansetron (ZOFRAN) injection 4 mg (not administered)  sodium chloride flush (NS) 0.9 % injection 3 mL (3 mLs Intravenous Not Given 05/20/16 2134)  piperacillin-tazobactam (ZOSYN) IVPB 3.375 g (3.375 g Intravenous Given 05/20/16 2329)  vancomycin (VANCOCIN) IVPB 1000 mg/200 mL premix (1,000 mg Intravenous Given 05/20/16 2332)  MEDLINE mouth rinse (15 mLs Mouth Rinse Not Given 05/20/16 2328)  nicotine (NICODERM CQ - dosed in mg/24 hours) patch 21 mg (not administered)  sodium chloride 0.9 % bolus 2,000 mL (0 mLs Intravenous Stopped 05/20/16 1625)  piperacillin-tazobactam (ZOSYN) IVPB 3.375 g (0 g Intravenous Stopped 05/20/16 1617)  vancomycin (VANCOCIN) IVPB 1000 mg/200 mL premix (0 mg Intravenous Stopped 05/20/16 1650)  sodium chloride  0.9 % bolus 1,000 mL (0 mLs Intravenous Stopped 05/20/16 1735)  iopamidol (ISOVUE-370) 76 % injection (70 mLs  Contrast Given 05/20/16 1609)  LORazepam (ATIVAN) injection 0.5 mg (0.5 mg Intravenous Given 05/20/16 1559)  fentaNYL (SUBLIMAZE) injection 50 mcg (50 mcg Intravenous Given 05/20/16 1723)  heparin bolus via infusion 4,000 Units (4,000 Units Intravenous Bolus from Bag 05/20/16 1752)  heparin bolus via infusion 3,000 Units (3,000 Units Intravenous Bolus from Bag 05/21/16 0030)     Initial Impression / Assessment and Plan / ED Course  I have reviewed the triage vital signs and the nursing notes.  Pertinent labs & imaging results that were available during my care of the patient were reviewed by me and considered in my medical decision making (see chart for details).  Clinical Course    Low-grade fever and tachycardia. Patient has mild elevation in lactic acid as well as significant elevation in white blood cell count. We'll start on sepsis protocol with broad-spectrum antibiotics and IV  fluids. Also concern for possible PE. Will get CT angiogram of the chest. Signed out to oncoming EDP.  Final Clinical Impressions(s) / ED Diagnoses   Final diagnoses:  Other acute pulmonary embolism without acute cor pulmonale (HCC)  Pericardial effusion  Atrial flutter, unspecified type [facility])    New Prescriptions Current Discharge Medication List       Julianne Rice, MD 05/21/16 [phone]

## 2016-05-20 NOTE — ED Triage Notes (Signed)
[facility] reports that patient has noticed ongoing fatigue and periodic shortness of breath since 09/17. Called EMS today after noticing increasing dyspnea, fatigue, and chest pain. When EMS arrived, the patients heart rate was variable between 190-260. Received 18mg  of adenosine and 30mg  of Cardizem during transport. Skin initally was ashen and BP was 142/83. After med administration bp reduced to 104/80 and HR was 140.

## 2016-05-20 NOTE — ED Notes (Signed)
Pt returned from ct and placed on monitor 

## 2016-05-20 NOTE — ED Notes (Signed)
Patient states he is too dehydrated to void. Advised he will continue to to receive IV fluids.

## 2016-05-20 NOTE — ED Provider Notes (Signed)
Patient's case assumed from Dr. Lita Mains. Not long after the patient's CT scan was completed I spoke with the radiologist and we discussed the patient's new left lower lobe pulmonary embolism with evidence for pulmonary infarct, as well as new finding of pericardial effusion.  On repeat exam the patient remained tachycardic, with increased work of breathing and with increasing fever. Patient had already received broad-spectrum antibiotics, 3 L fluid resuscitation, beyond appropriate sepsis measures.   Sepsis - Repeat Assessment  Performed at:    [facility]     Blood pressure 116/75, pulse (!) 137, temperature 101 F (38.3 C), temperature source Oral, resp. rate (!) 29, height 6\' 2"  (1.88 m), weight 167 lb (75.8 kg), SpO2 100 %.  Heart:     Tachycardic  Lungs:    CTA  Capillary Refill:   <2 sec  Skin:     Pale   Patient account of new atrial fibrillation versus atrial flutter with persistent heart rate in the 120/160 range.  I discussed the patient's case with our critical care colleagues given concern for new A. fib, as well as pulmonary embolism, with some evidence for concurrent infection.  Patient is mentating appropriately, and in spite of his persistent tachycardia, does not need emergent airway, he is deemed appropriate for a stepdown bed. I discussed the patient's findings thus far with him, including PE, pericardial effusion, concern for infection, new arrhythmia. Patient has received broad-spectrum in a box, will receive heparin drip. Patient awaiting admission to stepdown bed.   EKG with a flutter, 2:1 block, rate 144, ST wave changes, abnormal   CRITICAL CARE Performed by: Carmin Muskrat Total critical care time: 35 minutes Critical care time was exclusive of separately billable procedures and treating other patients. Critical care was necessary to treat or prevent imminent or life-threatening deterioration. Critical care was time spent personally by me on the  following activities: development of treatment plan with patient and/or surrogate as well as nursing, discussions with consultants, evaluation of patient's response to treatment, examination of patient, obtaining history from patient or surrogate, ordering and performing treatments and interventions, ordering and review of laboratory studies, ordering and review of radiographic studies, pulse oximetry and re-evaluation of patient's condition.        Carmin Muskrat, MD 05/20/16 [phone]

## 2016-05-20 NOTE — Progress Notes (Signed)
Pharmacy Antibiotic Note  Korin Saladino is a 45 y.o. male admitted on 05/20/2016 with pneumonia.  Pharmacy has been consulted for Vancomycin / Zosyn dosing.  Plan: Vancomycin 1 gram iv Q 8 Zosyn 3.375 grams iv Q 8 hours - 4 hr infusion  Follow up Scr, cultures, progress  Height: 6\' 2"  (188 cm) (per pt report) Weight: 167 lb (75.8 kg) (per pt report) IBW/kg (Calculated) : 82.2  Temp (24hrs), Avg:100.8 F (38.2 C), Min:100.5 F (38.1 C), Max:101 F (38.3 C)   Recent Labs Lab 05/20/16 1447 05/20/16 1450  WBC  --  20.4*  CREATININE  --  0.97  LATICACIDVEN 2.42*  --     Estimated Creatinine Clearance: 105.3 mL/min (by C-G formula based on SCr of 0.97 mg/dL).    No Known Allergies   Thank you for allowing pharmacy to be a part of this patient's care. Anette Guarneri, PharmD [phone] 05/20/2016 8:47 PM

## 2016-05-21 ENCOUNTER — Inpatient Hospital Stay (HOSPITAL_COMMUNITY): Payer: BLUE CROSS/BLUE SHIELD

## 2016-05-21 ENCOUNTER — Encounter (HOSPITAL_COMMUNITY): Payer: Self-pay | Admitting: Internal Medicine

## 2016-05-21 DIAGNOSIS — R000 Tachycardia, unspecified: Secondary | ICD-10-CM

## 2016-05-21 DIAGNOSIS — I313 Pericardial effusion (noninflammatory): Secondary | ICD-10-CM

## 2016-05-21 DIAGNOSIS — A419 Sepsis, unspecified organism: Secondary | ICD-10-CM | POA: Diagnosis present

## 2016-05-21 DIAGNOSIS — I3139 Other pericardial effusion (noninflammatory): Secondary | ICD-10-CM | POA: Diagnosis present

## 2016-05-21 DIAGNOSIS — F17200 Nicotine dependence, unspecified, uncomplicated: Secondary | ICD-10-CM | POA: Diagnosis present

## 2016-05-21 DIAGNOSIS — I319 Disease of pericardium, unspecified: Secondary | ICD-10-CM

## 2016-05-21 DIAGNOSIS — F1020 Alcohol dependence, uncomplicated: Secondary | ICD-10-CM | POA: Diagnosis present

## 2016-05-21 DIAGNOSIS — F1090 Alcohol use, unspecified, uncomplicated: Secondary | ICD-10-CM | POA: Diagnosis present

## 2016-05-21 DIAGNOSIS — I4892 Unspecified atrial flutter: Secondary | ICD-10-CM

## 2016-05-21 DIAGNOSIS — I2699 Other pulmonary embolism without acute cor pulmonale: Principal | ICD-10-CM

## 2016-05-21 HISTORY — DX: Unspecified atrial flutter: I48.92

## 2016-05-21 LAB — URINALYSIS, ROUTINE W REFLEX MICROSCOPIC
BILIRUBIN URINE: NEGATIVE
Glucose, UA: NEGATIVE mg/dL
Ketones, ur: NEGATIVE mg/dL
Leukocytes, UA: NEGATIVE
NITRITE: NEGATIVE
Protein, ur: 30 mg/dL — AB
pH: 6 (ref 5.0–8.0)

## 2016-05-21 LAB — ECHOCARDIOGRAM COMPLETE
Ao-asc: 34 cm
CHL CUP DOP CALC LVOT VTI: 13.2 cm
CHL CUP MV DEC (S): 148
CHL CUP RV SYS PRESS: 13 mmHg
E/e' ratio: 22.91
EWDT: 148 ms
FS: 25 % — AB (ref 28–44)
Height: 74 in
IV/PV OW: 0.85
LADIAMINDEX: 1.56 cm/m2
LASIZE: 31 mm
LAVOLA4C: 53.5 mL
LDCA: 5.31 cm2
LEFT ATRIUM END SYS DIAM: 31 mm
LV E/e' medial: 22.91
LV E/e'average: 22.91
LV PW d: 15.7 mm — AB (ref 0.6–1.1)
LV TDI E'LATERAL: 4.13
LV TDI E'MEDIAL: 4.9
LV e' LATERAL: 4.13 cm/s
LVOT peak vel: 73.9 cm/s
LVOTD: 26 mm
LVOTSV: 70 mL
Lateral S' vel: 3.59 cm/s
MV Peak grad: 4 mmHg
MV pk E vel: 94.6 m/s
Reg peak vel: 158 cm/s
TAPSE: 11.8 mm
TRMAXVEL: 158 cm/s
Weight: 2672 oz

## 2016-05-21 LAB — CBC
HCT: 34.2 % — ABNORMAL LOW (ref 39.0–52.0)
HEMOGLOBIN: 11.1 g/dL — AB (ref 13.0–17.0)
MCH: 29.1 pg (ref 26.0–34.0)
MCHC: 32.5 g/dL (ref 30.0–36.0)
MCV: 89.8 fL (ref 78.0–100.0)
Platelets: 319 10*3/uL (ref 150–400)
RBC: 3.81 MIL/uL — AB (ref 4.22–5.81)
RDW: 14.1 % (ref 11.5–15.5)
WBC: 18.9 10*3/uL — AB (ref 4.0–10.5)

## 2016-05-21 LAB — URINE MICROSCOPIC-ADD ON

## 2016-05-21 LAB — BASIC METABOLIC PANEL
ANION GAP: 8 (ref 5–15)
BUN: 9 mg/dL (ref 6–20)
CO2: 23 mmol/L (ref 22–32)
Calcium: 8 mg/dL — ABNORMAL LOW (ref 8.9–10.3)
Chloride: 105 mmol/L (ref 101–111)
Creatinine, Ser: 0.68 mg/dL (ref 0.61–1.24)
GFR calc Af Amer: >60 mL/min (ref >60)
Glucose, Bld: 102 mg/dL — ABNORMAL HIGH (ref 65–99)
POTASSIUM: 3.7 mmol/L (ref 3.5–5.1)
SODIUM: 136 mmol/L (ref 135–145)

## 2016-05-21 LAB — HIV ANTIBODY (ROUTINE TESTING W REFLEX): HIV Screen 4th Generation wRfx: NONREACTIVE

## 2016-05-21 LAB — RAPID URINE DRUG SCREEN, HOSP PERFORMED
AMPHETAMINES: NOT DETECTED
Barbiturates: NOT DETECTED
Benzodiazepines: NOT DETECTED
COCAINE: NOT DETECTED
OPIATES: POSITIVE — AB
TETRAHYDROCANNABINOL: POSITIVE — AB

## 2016-05-21 LAB — HEPARIN LEVEL (UNFRACTIONATED): HEPARIN UNFRACTIONATED: 0.19 [IU]/mL — AB (ref 0.30–0.70)

## 2016-05-21 LAB — LACTIC ACID, PLASMA: Lactic Acid, Venous: 0.8 mmol/L (ref 0.5–1.9)

## 2016-05-21 MED ORDER — NICOTINE 21 MG/24HR TD PT24
21.0000 mg | MEDICATED_PATCH | Freq: Every day | TRANSDERMAL | Status: DC
  Administered 2016-05-21 – 2016-05-26 (×6): 21 mg via TRANSDERMAL
  Filled 2016-05-21 (×6): qty 1

## 2016-05-21 MED ORDER — HEPARIN BOLUS VIA INFUSION
2200.0000 [IU] | Freq: Once | INTRAVENOUS | Status: AC
  Administered 2016-05-21: 2200 [IU] via INTRAVENOUS
  Filled 2016-05-21: qty 2200

## 2016-05-21 MED ORDER — HEPARIN BOLUS VIA INFUSION
3000.0000 [IU] | Freq: Once | INTRAVENOUS | Status: AC
  Administered 2016-05-21: 3000 [IU] via INTRAVENOUS
  Filled 2016-05-21: qty 3000

## 2016-05-21 MED ORDER — METOPROLOL TARTRATE 25 MG PO TABS
25.0000 mg | ORAL_TABLET | Freq: Two times a day (BID) | ORAL | Status: DC
  Administered 2016-05-21 (×2): 25 mg via ORAL
  Filled 2016-05-21 (×2): qty 1

## 2016-05-21 NOTE — Progress Notes (Signed)
PROGRESS NOTE    Carl Bush  [id number] DOB: 04/20/1971 DOA: 05/20/2016 PCP: No primary care provider on file.  Brief Narrative: Carl Bush is a 45 y.o. male with no significant past medical history presenting with chest pain, SOB.    Since September, he has noticed upper chest pain with any exertion.  Light-headed.  SOB.  Occasional leg spasms.  Pain in upper chest and down below rib cage.  Pain is constant, SOB is also constant.  11/13 night, his legs spasmed and he got dizzy, light-headed, chest pain, SOB - had to drop onto the kitchen floor to recover. Fever to 100.5 in ER, thinks he also has been febrile at home for especially the last 2 nights. Thinks he has a problem with ETOH.  20+ year history, drinks 12 pack QOD  Assessment & Plan:   Pulmonary embolism -acute/subacute pulmonary embolism with pulmonary infarct -Continue IV heparin -admitting M.D.consulted PCCM and suggested hospitalist admission to SDU -ABG with respiratory alkalosis -CT angiogram with moderate pericardial effusion andEKG as well as rhythm concerning for atrial flutter -Add low-dose metoprolol, will request cardiology consult -Check 2-D  Echocardiogram today  Sepsis -fever, tachypnea, leukocytosis likely related to PE/ pulmonary infarcts -We will continue empiric antibiotics today -Follow-up HIV  Moderate pericardial effusion -Noted on CT chest, 2-D echo today -Cards consulted  Atrial flutter -Likely due to PE -Add low-dose metoprolol -On full dose anticoagulation  Tobacco dependence -Encourage cessation.  This was discussed with the patient and should be reviewed on an ongoing basis.   -Patch ordered at patient request.  Alcohol dependence -Acknowledges problem but does not desire to quit at this time. -monitor on CIWA protocol.  DVT prophylaxis: Heparin drip Code Status:  Full - confirmed with patient Family Communication: None present  Disposition Plan: keep in stepdown unit,  may need transfer to ICU if declines  Consultants:   Cards   Subjective: Feels better,  Objective: Vitals:   05/21/16 0815 05/21/16 0900 05/21/16 1000 05/21/16 1100  BP: 102/74 113/80 118/77   Pulse: (!) 124 (!) 123 (!) 122   Resp: (!) 23 (!) 24 (!) 24   Temp: 97.9 F (36.6 C)   98.7 F (37.1 C)  TempSrc: Oral   Oral  SpO2: 97% 99% 98%   Weight:      Height:        Intake/Output Summary (Last 24 hours) at 05/21/16 1334 Last data filed at 05/21/16 1208  Gross per 24 hour  Intake          4401.25 ml  Output             2600 ml  Net          1801.25 ml   Filed Weights   05/20/16 1700  Weight: 75.8 kg (167 lb)    Examination:  General exam: Appears calm and comfortable, AAO 3 Respiratory system: Clear to auscultation. Respiratory effort normal. Cardiovascular system: S1 & S2 heard,tachycardic, irregular Gastrointestinal system: Abdomen is nondistended, soft and nontender.   Central nervous system: Alert and oriented. No focal neurological deficits. Extremities: Symmetric 5 x 5 power. Skin: No rashes, lesions or ulcers Psychiatry: Judgement and insight appear normal. Mood & affect appropriate.     Data Reviewed: I have personally reviewed following labs and imaging studies  CBC:  Recent Labs Lab 05/20/16 1450 05/21/16 0713  WBC 20.4* 18.9*  NEUTROABS 16.2*  --   HGB 13.1 11.1*  HCT 39.5 34.2*  MCV 90.8 89.8  PLT 390  99991111   Basic Metabolic Panel:  Recent Labs Lab 05/20/16 1450 05/21/16 0713  NA 135 136  K 3.9 3.7  CL 102 105  CO2 23 23  GLUCOSE 138* 102*  BUN 14 9  CREATININE 0.97 0.68  CALCIUM 8.8* 8.0*   GFR: Estimated Creatinine Clearance: 127.6 mL/min (by C-G formula based on SCr of 0.68 mg/dL). Liver Function Tests:  Recent Labs Lab 05/20/16 1450  AST 37  ALT 25  ALKPHOS 108  BILITOT 0.7  PROT 6.8  ALBUMIN 3.2*   No results for input(s): LIPASE, AMYLASE in the last 168 hours. No results for input(s): AMMONIA in the last 168  hours. Coagulation Profile:  Recent Labs Lab 05/20/16 2100  INR 1.40   Cardiac Enzymes: No results for input(s): CKTOTAL, CKMB, CKMBINDEX, TROPONINI in the last 168 hours. BNP (last 3 results) No results for input(s): PROBNP in the last 8760 hours. HbA1C: No results for input(s): HGBA1C in the last 72 hours. CBG:  Recent Labs Lab 05/20/16 1423  GLUCAP 154*   Lipid Profile: No results for input(s): CHOL, HDL, LDLCALC, TRIG, CHOLHDL, LDLDIRECT in the last 72 hours. Thyroid Function Tests:  Recent Labs  05/20/16 1450  TSH 2.007  FREET4 1.02   Anemia Panel: No results for input(s): VITAMINB12, FOLATE, FERRITIN, TIBC, IRON, RETICCTPCT in the last 72 hours. Urine analysis:    Component Value Date/Time   COLORURINE AMBER (A) 05/21/2016 0100   APPEARANCEUR CLEAR 05/21/2016 0100   LABSPEC >1.046 (H) 05/21/2016 0100   PHURINE 6.0 05/21/2016 0100   GLUCOSEU NEGATIVE 05/21/2016 0100   HGBUR MODERATE (A) 05/21/2016 0100   BILIRUBINUR NEGATIVE 05/21/2016 0100   KETONESUR NEGATIVE 05/21/2016 0100   PROTEINUR 30 (A) 05/21/2016 0100   NITRITE NEGATIVE 05/21/2016 0100   LEUKOCYTESUR NEGATIVE 05/21/2016 0100   Sepsis Labs: @LABRCNTIP (procalcitonin:4,lacticidven:4)  ) Recent Results (from the past 240 hour(s))  MRSA PCR Screening     Status: None   Collection Time: 05/20/16  8:44 PM  Result Value Ref Range Status   MRSA by PCR NEGATIVE NEGATIVE Final    Comment:        The GeneXpert MRSA Assay (FDA approved for NASAL specimens only), is one component of a comprehensive MRSA colonization surveillance program. It is not intended to diagnose MRSA infection nor to guide or monitor treatment for MRSA infections.          Radiology Studies: Ct Angio Chest Pe W Or Wo Contrast  Result Date: 05/20/2016 CLINICAL DATA:  45 y/o M; upper chest pain and shortness of breath. History of smoking. EXAM: CT ANGIOGRAPHY CHEST WITH CONTRAST TECHNIQUE: Multidetector CT imaging of  the chest was performed using the standard protocol during bolus administration of intravenous contrast. Multiplanar CT image reconstructions and MIPs were obtained to evaluate the vascular anatomy. CONTRAST:  70 cc Isovue 370 COMPARISON:  None. FINDINGS: Cardiovascular: Satisfactory opacification of pulmonary arteries to the segmental level. Multiple segmental pulmonary arteries of the left lung base are occluded consistent with pulmonary embolus. No evidence for right heart strain. Normal heart size. Moderate pericardial effusion. Mild coronary artery calcifications. Normal caliber main pulmonary artery. Normal caliber thoracic aorta. Mediastinum/Nodes: Prominent subcentimeter mediastinal lymph nodes are likely reactive. Thyroid gland, trachea, and esophagus demonstrate no significant findings. Lungs/Pleura: Mild centrilobular emphysema. Ill-defined ground-glass opacity at the left lung base in region of multiple occluded segmental pulmonary emboli. Small left-sided pleural effusion. Upper Abdomen: Gastrohepatic ligament lymphadenopathy of uncertain significance. Possible periportal edema. Musculoskeletal: No chest wall abnormality. No acute  or significant osseous findings. Review of the MIP images confirms the above findings. IMPRESSION: 1. Left lower lobe segmental pulmonary emboli. 2. Ground-glass opacity in the left lower lobe in region of pulmonary emboli is probably a developing pulmonary infarct, less likely pneumonia. 3. Small left-sided pleural effusion. 4. Moderate pericardial effusion. 5. Gastrohepatic ligament lymphadenopathy and possible periportal edema in the liver of uncertain significance, partially visualized. These results were called by telephone at the time of interpretation on 05/20/2016 at 4:31 pm to Dr. Vanita Panda, who verbally acknowledged these results. Electronically Signed   By: Kristine Garbe M.D.   On: 05/20/2016 16:35   Dg Chest Port 1 View  Result Date:  05/20/2016 CLINICAL DATA:  Increasing chest pain and shortness of breath, fluttering in chest over last couple weeks, smoker EXAM: PORTABLE CHEST 1 VIEW COMPARISON:  Portable exam 1501 hours without priors for comparison. FINDINGS: Minimal enlargement of cardiac silhouette. Mediastinal contours and pulmonary vascularity normal. Suspicion of retrocardiac LEFT lower lobe infiltrate with loss of the LEFT diaphragmatic silhouette. Remaining lungs clear. No pleural effusion or pneumothorax. Bones unremarkable. IMPRESSION: Minimal enlargement of cardiac silhouette. Question LEFT lower lobe consolidation/pneumonia. Electronically Signed   By: Lavonia Dana M.D.   On: 05/20/2016 15:19        Scheduled Meds: . folic acid  1 mg Oral Daily  . mouth rinse  15 mL Mouth Rinse BID  . metoprolol tartrate  25 mg Oral BID  . multivitamin with minerals  1 tablet Oral Daily  . nicotine  21 mg Transdermal Daily  . piperacillin-tazobactam (ZOSYN)  IV  3.375 g Intravenous Q8H  . sodium chloride flush  3 mL Intravenous Q12H  . thiamine  100 mg Oral Daily   Or  . thiamine  100 mg Intravenous Daily  . vancomycin  1,000 mg Intravenous Q8H   Continuous Infusions: . heparin 1,800 Units/hr (05/21/16 0920)     LOS: 1 day    Time spent: 20min    Domenic Polite, MD [facility] Pager [phone]  If 7PM-7AM, please contact night-coverage [email or web address] Password [email or web address] 05/21/2016, 1:34 PM

## 2016-05-21 NOTE — Progress Notes (Signed)
  Echocardiogram 2D Echocardiogram has been performed.  Carl Bush 05/21/2016, 11:46 AM

## 2016-05-21 NOTE — Progress Notes (Signed)
[place] for heparin Indication: pulmonary embolus  No Known Allergies  Patient Measurements: Height: 6\' 2"  (188 cm) (per pt report) Weight: 167 lb (75.8 kg) (per pt report) IBW/kg (Calculated) : 82.2 Heparin Dosing Weight: 75.8 kg  Vital Signs: Temp: 98.4 F (36.9 C) (11/15 1547) Temp Source: Oral (11/15 1547) BP: 102/74 (11/15 1600) Pulse Rate: 127 (11/15 1600)  Labs:  Recent Labs  05/20/16 1450 05/20/16 2100 05/20/16 2332 05/21/16 0713 05/21/16 1535  HGB 13.1  --   --  11.1*  --   HCT 39.5  --   --  34.2*  --   PLT 390  --   --  319  --   APTT  --  51*  --   --   --   LABPROT  --  17.2*  --   --   --   INR  --  1.40  --   --   --   HEPARINUNFRC  --   --  <0.10* <0.10* 0.19*  CREATININE 0.97  --   --  0.68  --     Estimated Creatinine Clearance: 127.6 mL/min (by C-G formula based on SCr of 0.68 mg/dL).  Assessment: 45 y.o. male with PE and afib on heparin and the heparin level remains below goal on 1800 units/hr. HL 0.19 -Hg= 13.1>>11.2 (likely dilutional), plt= 319  Goal of Therapy:  Heparin level 0.3-0.7 units/ml Monitor platelets by anticoagulation protocol: Yes   Plan:  -Heparin 2200 units IV bolus, then increase heparin 2000 units/hr -Heparin level in 6 hours and daily wth CBC daily   Abdelaziz Westenberger S. Alford Highland, PharmD, [facility] Clinical Staff Pharmacist Pager [phone]   05/21/2016 4:45 PM

## 2016-05-21 NOTE — Progress Notes (Signed)
[place] for heparin Indication: pulmonary embolus  No Known Allergies  Patient Measurements: Height: 6\' 2"  (188 cm) (per pt report) Weight: 167 lb (75.8 kg) (per pt report) IBW/kg (Calculated) : 82.2 Heparin Dosing Weight: 75.8 kg  Vital Signs: Temp: 100.9 F (38.3 C) (11/14 2330) Temp Source: Oral (11/14 2330) BP: 128/93 (11/14 2330) Pulse Rate: 130 (11/14 2330)  Labs:  Recent Labs  05/20/16 1450 05/20/16 2100 05/20/16 2332  HGB 13.1  --   --   HCT 39.5  --   --   PLT 390  --   --   APTT  --  51*  --   LABPROT  --  17.2*  --   INR  --  1.40  --   HEPARINUNFRC  --   --  <0.10*  CREATININE 0.97  --   --     Estimated Creatinine Clearance: 105.3 mL/min (by C-G formula based on SCr of 0.97 mg/dL).  Assessment: 45 y.o. male with PE for heparin  Goal of Therapy:  Heparin level 0.3-0.7 units/ml Monitor platelets by anticoagulation protocol: Yes   Plan:  Heparin 3000 units IV bolus, then increase heparin 1500 units/hr Follow-up am labs.   Phillis Knack, PharmD, BCPS  05/21/2016 12:26 AM

## 2016-05-21 NOTE — Progress Notes (Signed)
Cm sec checked on copay for xarelto and eliquis. Left 30day free and copay cards for both drugs in room til md decides whick to use. Looks like no copay w wither drug md chooses. S/W [facility] @ [facility] # [phone]   1.XARELTO 15 MG BID  COVER- YES  CO-PAY- ZERO DOLLARS 100 %  TIER- 3 DRUG  PRIOR APPROVAL - NO   XARELTO 20 MG BID  ** SAME AS ABOVE **   2. ELIQUIS 2.5 MG  COVER- YES  CO-PAY - ZERO DOLLARS  100 %  TIER- 4 DRUG  PRIOR APPROVAL- NO   ELIQUIS 5 MG BID  ** SAME AS ABOVE **   APIXIBAN : NONE FORMULARY   PHARMACY : [facility]

## 2016-05-21 NOTE — Consult Note (Addendum)
Reason for Consult: pericardial effusion , A flutter   Referring Physician: Dr. Broadus John   PCP:  No primary care provider on file.  Dr. Neta Mends   Primary Cardiologist:new Dr. Naitik Hermann is an 45 y.o. male.    Chief Complaint: SOB with admit 05/20/16     HPI: Asked to see 43 yom with hx of alcohol and tobacco dependence and recent therapy for rabid racoon bite presented to ER with SOB and chest pain across chest.  No nausea but at times he would lie on floor because he thought he would pass out.  This began end of August and initially he thought it was due to the treatment for the racoon bite.  But it has continued to increase.  The SOB is only with exertion and the chest pain has awakened him from sleep at night.  It has continued to progress.  Also aware of rapid HR.  Yesterday symptoms were the worst.  EMS called and Pt found to be in a flutter and he rec'd 6 & 12 of adenosine and 30 mg IV dilt.    In ER BNP 104.7 trop. poc 0.01, lactic acid 2.42 Lytes stable and WBC 20.4 with normal H/H.  TSH 2.007, Free T4 1.02   CXR minimal cardiac enlargement and LLL poss consolidation, pna. CT A of chest lt lower segment PE,  Possible pul. Infarct and moderate pericardial effusion.  No rt heart strain.   EKG yesterday   A flutter RBBB ? Lateral ischemia difficult to decide with flutter waves. EKG today a flutter RBBB and less R wave in V 2 and V3.    Echo done today with EF 45-50%, mild to moderate concentric hypertrophy and moderate pericardial effusion was  identified. The fluid contained focal strands near the RV apex   and possible thrombus concerning for hemopericadium. There was mildright ventricular chamber collapse. Respirophasic change in stroke volume was normal. Features were consistent with early tamponade physiology.  Currently eating lunch denies SOB, feels better.  Has no change in respirations if sitting up or lying down.  He is on IV heparin, lopressor 25  BID.  BP continues to be soft.  HR 120s.  A flutter    Pt is concerned the effusion may be related to Lyme's disease, he does not know if he has had a tick bite, but is outside with yardwork a lot.   Past Medical History:  Diagnosis Date  . Alcohol dependence ([place])   . Atrial flutter ([place]) 05/21/2016  . 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 Tobacco dependence     Past Surgical History:  Procedure Laterality Date  . SKIN BIOPSY  11/2015    Family History  Problem Relation Age of Onset  . Healthy Mother   . Healthy Sister   . Healthy Brother   . Heart attack Maternal Grandfather 65   Social History:  reports that he has been smoking.  He has a 24.00 pack-year smoking history. He has never used smokeless tobacco. He reports that he drinks about 25.2 oz of alcohol per week . He reports that he uses drugs, including Marijuana.  Allergies: No Known Allergies  OUTPATIENT MEDICATIONS:  iBUPROFEN prn  CURRENT MEDICATIONS:  Scheduled Meds: . folic acid  1 mg Oral Daily  . mouth rinse  15 mL Mouth Rinse BID  . metoprolol tartrate  25 mg Oral BID  . multivitamin with minerals  1 tablet Oral Daily  . nicotine  21 mg Transdermal Daily  . piperacillin-tazobactam (ZOSYN)  IV  3.375 g Intravenous Q8H  . sodium chloride flush  3 mL Intravenous Q12H  . thiamine  100 mg Oral Daily   Or  . thiamine  100 mg Intravenous Daily  . vancomycin  1,000 mg Intravenous Q8H   Continuous Infusions: . heparin 1,800 Units/hr (05/21/16 0920)   PRN Meds:.acetaminophen, LORazepam **OR** LORazepam, morphine injection, ondansetron **OR** ondansetron (ZOFRAN) IV   Results for orders placed or performed during the hospital encounter of 05/20/16 (from the past 48 hour(s))  CBG monitoring, ED     Status: Abnormal   Collection Time: 05/20/16  2:23 PM  Result Value Ref Range   Glucose-Capillary 154 (H) 65 - 99 mg/dL   Comment 1  Notify RN   I-Stat Troponin, ED (not at [facility])     Status: None   Collection Time: 05/20/16  2:45 PM  Result Value Ref Range   Troponin i, poc 0.01 0.00 - 0.08 ng/mL   Comment 3            Comment: Due to the release kinetics of cTnI, a negative result within the first hours of the onset of symptoms does not rule out myocardial infarction with certainty. If myocardial infarction is still suspected, repeat the test at appropriate intervals.   I-Stat CG4 Lactic Acid, ED     Status: Abnormal   Collection Time: 05/20/16  2:47 PM  Result Value Ref Range   Lactic Acid, Venous 2.42 (HH) 0.5 - 1.9 mmol/L   Comment NOTIFIED PHYSICIAN   CBC with Differential/Platelet     Status: Abnormal   Collection Time: 05/20/16  2:50 PM  Result Value Ref Range   WBC 20.4 (H) 4.0 - 10.5 K/uL   RBC 4.35 4.22 - 5.81 MIL/uL   Hemoglobin 13.1 13.0 - 17.0 g/dL   HCT 39.5 39.0 - 52.0 %   MCV 90.8 78.0 - 100.0 fL   MCH 30.1 26.0 - 34.0 pg   MCHC 33.2 30.0 - 36.0 g/dL   RDW 14.2 11.5 - 15.5 %   Platelets 390 150 - 400 K/uL   Neutrophils Relative % 79 %   Lymphocytes Relative 10 %   Monocytes Relative 10 %   Eosinophils Relative 1 %   Basophils Relative 0 %   Neutro Abs 16.2 (H) 1.7 - 7.7 K/uL   Lymphs Abs 2.0 0.7 - 4.0 K/uL   Monocytes Absolute 2.0 (H) 0.1 - 1.0 K/uL   Eosinophils Absolute 0.2 0.0 - 0.7 K/uL   Basophils Absolute 0.0 0.0 - 0.1 K/uL   WBC Morphology ATYPICAL LYMPHOCYTES   Comprehensive metabolic panel     Status: Abnormal   Collection Time: 05/20/16  2:50 PM  Result Value Ref Range   Sodium 135 135 - 145 mmol/L   Potassium 3.9 3.5 - 5.1 mmol/L   Chloride 102 101 - 111 mmol/L   CO2 23 22 - 32 mmol/L   Glucose, Bld 138 (H) 65 - 99 mg/dL   BUN 14 6 - 20 mg/dL   Creatinine, Ser 0.97 0.61 - 1.24 mg/dL   Calcium 8.8 (L) 8.9 - 10.3 mg/dL   Total Protein 6.8 6.5 - 8.1 g/dL   Albumin 3.2 (L) 3.5 - 5.0 g/dL   AST 37 15 - 41 U/L   ALT 25 17 - 63 U/L   Alkaline Phosphatase 108 38 -  126 U/L     Total Bilirubin 0.7 0.3 - 1.2 mg/dL   GFR calc non Af Amer >60 >60 mL/min   GFR calc Af Amer >60 >60 mL/min    Comment: (NOTE) The eGFR has been calculated using the CKD EPI equation. This calculation has not been validated in all clinical situations. eGFR's persistently <60 mL/min signify possible Chronic Kidney Disease.    Anion gap 10 5 - 15  Brain natriuretic peptide     Status: Abnormal   Collection Time: 05/20/16  2:50 PM  Result Value Ref Range   B Natriuretic Peptide 104.7 (H) 0.0 - 100.0 pg/mL  Ethanol     Status: None   Collection Time: 05/20/16  2:50 PM  Result Value Ref Range   Alcohol, Ethyl (B) <5 <5 mg/dL    Comment:        LOWEST DETECTABLE LIMIT FOR SERUM ALCOHOL IS 5 mg/dL FOR MEDICAL PURPOSES ONLY   TSH     Status: None   Collection Time: 05/20/16  2:50 PM  Result Value Ref Range   TSH 2.007 0.350 - 4.500 uIU/mL    Comment: Performed by a 3rd Generation assay with a functional sensitivity of <=0.01 uIU/mL.  T4, free     Status: None   Collection Time: 05/20/16  2:50 PM  Result Value Ref Range   Free T4 1.02 0.61 - 1.12 ng/dL    Comment: (NOTE) Biotin ingestion may interfere with free T4 tests. If the results are inconsistent with the TSH level, previous test results, or the clinical presentation, then consider biotin interference. If needed, order repeat testing after stopping biotin.   MRSA PCR Screening     Status: None   Collection Time: 05/20/16  8:44 PM  Result Value Ref Range   MRSA by PCR NEGATIVE NEGATIVE    Comment:        The GeneXpert MRSA Assay (FDA approved for NASAL specimens only), is one component of a comprehensive MRSA colonization surveillance program. It is not intended to diagnose MRSA infection nor to guide or monitor treatment for MRSA infections.   Procalcitonin     Status: None   Collection Time: 05/20/16  9:00 PM  Result Value Ref Range   Procalcitonin 0.21 ng/mL    Comment:        Interpretation: PCT  (Procalcitonin) <= 0.5 ng/mL: Systemic infection (sepsis) is not likely. Local bacterial infection is possible. (NOTE)         ICU PCT Algorithm               Non ICU PCT Algorithm    ----------------------------     ------------------------------         PCT < 0.25 ng/mL                 PCT < 0.1 ng/mL     Stopping of antibiotics            Stopping of antibiotics       strongly encouraged.               strongly encouraged.    ----------------------------     ------------------------------       PCT level decrease by               PCT < 0.25 ng/mL       >= 80% from peak PCT       OR PCT 0.25 - 0.5 ng/mL  Stopping of antibiotics                                             encouraged.     Stopping of antibiotics           encouraged.    ----------------------------     ------------------------------       PCT level decrease by              PCT >= 0.25 ng/mL       < 80% from peak PCT        AND PCT >= 0.5 ng/mL            Continuin g antibiotics                                              encouraged.       Continuing antibiotics            encouraged.    ----------------------------     ------------------------------     PCT level increase compared          PCT > 0.5 ng/mL         with peak PCT AND          PCT >= 0.5 ng/mL             Escalation of antibiotics                                          strongly encouraged.      Escalation of antibiotics        strongly encouraged.   Protime-INR     Status: Abnormal   Collection Time: 05/20/16  9:00 PM  Result Value Ref Range   Prothrombin Time 17.2 (H) 11.4 - 15.2 seconds   INR 1.40   APTT     Status: Abnormal   Collection Time: 05/20/16  9:00 PM  Result Value Ref Range   aPTT 51 (H) 24 - 36 seconds    Comment:        IF BASELINE aPTT IS ELEVATED, SUGGEST PATIENT RISK ASSESSMENT BE USED TO DETERMINE APPROPRIATE ANTICOAGULANT THERAPY.   Lactic acid, plasma     Status: None   Collection Time: 05/20/16  9:07 PM    Result Value Ref Range   Lactic Acid, Venous 1.1 0.5 - 1.9 mmol/L  Blood gas, arterial     Status: Abnormal   Collection Time: 05/20/16  9:40 PM  Result Value Ref Range   O2 Content 2.0 L/min   Delivery systems NASAL CANNULA    pH, Arterial 7.461 (H) 7.350 - 7.450   pCO2 arterial 29.2 (L) 32.0 - 48.0 mmHg   pO2, Arterial 78.9 (L) 83.0 - 108.0 mmHg   Bicarbonate 20.6 20.0 - 28.0 mmol/L   Acid-base deficit 2.7 (H) 0.0 - 2.0 mmol/L   O2 Saturation 95.2 %   Patient temperature 98.6    Collection site RIGHT RADIAL    Drawn by [id number]    Sample type ARTERIAL DRAW    Allens test (pass/fail) PASS PASS  Heparin level (unfractionated)     Status: Abnormal   Collection  Time: 05/20/16 11:32 PM  Result Value Ref Range   Heparin Unfractionated <0.10 (L) 0.30 - 0.70 IU/mL    Comment:        IF HEPARIN RESULTS ARE BELOW EXPECTED VALUES, AND PATIENT DOSAGE HAS BEEN CONFIRMED, SUGGEST FOLLOW UP TESTING OF ANTITHROMBIN III LEVELS. REPEATED TO VERIFY   Lactic acid, plasma     Status: None   Collection Time: 05/20/16 11:32 PM  Result Value Ref Range   Lactic Acid, Venous 0.8 0.5 - 1.9 mmol/L  Urinalysis, Routine w reflex microscopic (not at [facility])     Status: Abnormal   Collection Time: 05/21/16  1:00 AM  Result Value Ref Range   Color, Urine AMBER (A) YELLOW    Comment: BIOCHEMICALS MAY BE AFFECTED BY COLOR   APPearance CLEAR CLEAR   Specific Gravity, Urine >1.046 (H) 1.005 - 1.030   pH 6.0 5.0 - 8.0   Glucose, UA NEGATIVE NEGATIVE mg/dL   Hgb urine dipstick MODERATE (A) NEGATIVE   Bilirubin Urine NEGATIVE NEGATIVE   Ketones, ur NEGATIVE NEGATIVE mg/dL   Protein, ur 30 (A) NEGATIVE mg/dL   Nitrite NEGATIVE NEGATIVE   Leukocytes, UA NEGATIVE NEGATIVE  Urine rapid drug screen (hosp performed)     Status: Abnormal   Collection Time: 05/21/16  1:00 AM  Result Value Ref Range   Opiates POSITIVE (A) NONE DETECTED   Cocaine NONE DETECTED NONE DETECTED   Benzodiazepines NONE DETECTED NONE  DETECTED   Amphetamines NONE DETECTED NONE DETECTED   Tetrahydrocannabinol POSITIVE (A) NONE DETECTED   Barbiturates NONE DETECTED NONE DETECTED    Comment:        DRUG SCREEN FOR MEDICAL PURPOSES ONLY.  IF CONFIRMATION IS NEEDED FOR ANY PURPOSE, NOTIFY LAB WITHIN 5 DAYS.        LOWEST DETECTABLE LIMITS FOR URINE DRUG SCREEN Drug Class       Cutoff (ng/mL) Amphetamine      1000 Barbiturate      200 Benzodiazepine   267 Tricyclics       124 Opiates          300 Cocaine          300 THC              50   Urine microscopic-add on     Status: Abnormal   Collection Time: 05/21/16  1:00 AM  Result Value Ref Range   Squamous Epithelial / LPF 0-5 (A) NONE SEEN   WBC, UA 0-5 0 - 5 WBC/hpf   RBC / HPF 0-5 0 - 5 RBC/hpf   Bacteria, UA FEW (A) NONE SEEN   Casts HYALINE CASTS (A) NEGATIVE   Urine-Other MUCOUS PRESENT   HIV antibody     Status: None   Collection Time: 05/21/16  2:11 AM  Result Value Ref Range   HIV Screen 4th Generation wRfx Non Reactive Non Reactive    Comment: (NOTE) Performed At: [facility] [place], [place] [id number] Lindon Romp MD [id number]   Basic metabolic panel     Status: Abnormal   Collection Time: 05/21/16  7:13 AM  Result Value Ref Range   Sodium 136 135 - 145 mmol/L   Potassium 3.7 3.5 - 5.1 mmol/L   Chloride 105 101 - 111 mmol/L   CO2 23 22 - 32 mmol/L   Glucose, Bld 102 (H) 65 - 99 mg/dL   BUN 9 6 - 20 mg/dL   Creatinine, Ser 0.68 0.61 - 1.24 mg/dL  Calcium 8.0 (L) 8.9 - 10.3 mg/dL   GFR calc non Af Amer >60 >60 mL/min   GFR calc Af Amer >60 >60 mL/min    Comment: (NOTE) The eGFR has been calculated using the CKD EPI equation. This calculation has not been validated in all clinical situations. eGFR's persistently <60 mL/min signify possible Chronic Kidney Disease.    Anion gap 8 5 - 15  Heparin level (unfractionated)     Status: Abnormal   Collection Time: 05/21/16  7:13 AM  Result Value Ref Range    Heparin Unfractionated <0.10 (L) 0.30 - 0.70 IU/mL    Comment:        IF HEPARIN RESULTS ARE BELOW EXPECTED VALUES, AND PATIENT DOSAGE HAS BEEN CONFIRMED, SUGGEST FOLLOW UP TESTING OF ANTITHROMBIN III LEVELS.   CBC     Status: Abnormal   Collection Time: 05/21/16  7:13 AM  Result Value Ref Range   WBC 18.9 (H) 4.0 - 10.5 K/uL   RBC 3.81 (L) 4.22 - 5.81 MIL/uL   Hemoglobin 11.1 (L) 13.0 - 17.0 g/dL   HCT 34.2 (L) 39.0 - 52.0 %   MCV 89.8 78.0 - 100.0 fL   MCH 29.1 26.0 - 34.0 pg   MCHC 32.5 30.0 - 36.0 g/dL   RDW 14.1 11.5 - 15.5 %   Platelets 319 150 - 400 K/uL   Ct Angio Chest Pe W Or Wo Contrast  Result Date: 05/20/2016 CLINICAL DATA:  45 y/o M; upper chest pain and shortness of breath. History of smoking. EXAM: CT ANGIOGRAPHY CHEST WITH CONTRAST TECHNIQUE: Multidetector CT imaging of the chest was performed using the standard protocol during bolus administration of intravenous contrast. Multiplanar CT image reconstructions and MIPs were obtained to evaluate the vascular anatomy. CONTRAST:  70 cc Isovue 370 COMPARISON:  None. FINDINGS: Cardiovascular: Satisfactory opacification of pulmonary arteries to the segmental level. Multiple segmental pulmonary arteries of the left lung base are occluded consistent with pulmonary embolus. No evidence for right heart strain. Normal heart size. Moderate pericardial effusion. Mild coronary artery calcifications. Normal caliber main pulmonary artery. Normal caliber thoracic aorta. Mediastinum/Nodes: Prominent subcentimeter mediastinal lymph nodes are likely reactive. Thyroid gland, trachea, and esophagus demonstrate no significant findings. Lungs/Pleura: Mild centrilobular emphysema. Ill-defined ground-glass opacity at the left lung base in region of multiple occluded segmental pulmonary emboli. Small left-sided pleural effusion. Upper Abdomen: Gastrohepatic ligament lymphadenopathy of uncertain significance. Possible periportal edema. Musculoskeletal:  No chest wall abnormality. No acute or significant osseous findings. Review of the MIP images confirms the above findings. IMPRESSION: 1. Left lower lobe segmental pulmonary emboli. 2. Ground-glass opacity in the left lower lobe in region of pulmonary emboli is probably a developing pulmonary infarct, less likely pneumonia. 3. Small left-sided pleural effusion. 4. Moderate pericardial effusion. 5. Gastrohepatic ligament lymphadenopathy and possible periportal edema in the liver of uncertain significance, partially visualized. These results were called by telephone at the time of interpretation on 05/20/2016 at 4:31 pm to Dr. Vanita Panda, who verbally acknowledged these results. Electronically Signed   By: Kristine Garbe M.D.   On: 05/20/2016 16:35   Dg Chest Port 1 View  Result Date: 05/20/2016 CLINICAL DATA:  Increasing chest pain and shortness of breath, fluttering in chest over last couple weeks, smoker EXAM: PORTABLE CHEST 1 VIEW COMPARISON:  Portable exam 1501 hours without priors for comparison. FINDINGS: Minimal enlargement of cardiac silhouette. Mediastinal contours and pulmonary vascularity normal. Suspicion of retrocardiac LEFT lower lobe infiltrate with loss of the LEFT diaphragmatic silhouette. Remaining lungs  clear. No pleural effusion or pneumothorax. Bones unremarkable. IMPRESSION: Minimal enlargement of cardiac silhouette. Question LEFT lower lobe consolidation/pneumonia. Electronically Signed   By: Lavonia Dana M.D.   On: 05/20/2016 15:19    ROS: General:no colds or fevers, no weight changes Skin:no rashes or ulcers HEENT:no blurred vision, no congestion CV:see HPI PUL:see HPI GI:no diarrhea constipation or melena, no indigestion GU:no hematuria, no dysuria MS:no joint pain, no claudication Neuro:no syncope, no lightheadedness Endo:no diabetes, no thyroid disease   Blood pressure 122/86, pulse (!) 123, temperature 98.7 F (37.1 C), temperature source Oral, resp. rate (!)  26, height '6\' 2"'$  (1.88 m), weight 167 lb (75.8 kg), SpO2 95 %.  Wt Readings from Last 3 Encounters:  05/20/16 167 lb (75.8 kg)    PE: General:Pleasant affect, NAD Skin:Warm to very warm and dry, brisk capillary refill HEENT:normocephalic, sclera clear, mucus membranes moist Neck:supple, + JVD to jaw while flat, no bruits  Heart:S1S2 RRR and rapid without murmur, gallup, rub or click Lungs:clear without rales, rhonchi, or wheezes MWN:UUVO, non tender, + BS, do not palpate liver spleen or masses Ext:no lower ext edema, 2+ pedal pulses, 2+ radial pulses Neuro:alert and oriented X 3, MAE, follows commands, + facial symmetry Psych: pleasant affect, interactive.   Pulsus paradoxus at least 10 mmHg, but was difficult to obtain.      Assessment/Plan  Principal Problem:   Pulmonary embolism (HCC) Active Problems:   Sepsis ([place])   Tobacco use disorder   Alcohol dependence (HCC)   Atrial flutter (HCC)   Tachycardia   Effusion, pericardium  1. Pericardial effusion-mod  pt currently without SOB.    2. Typical A flutter RVR with abnormal EKG difficult to eval ST due to flutter waves -on lopressor 25 BID, not controlling rate- bP improved bu   3. Pulmonary embolism no rt heart strain.  On IV heparin   4.  Sepsis on abx- followed by [place]  Nurse Practitioner Certified [place] Pager [phone] or after 5pm or weekends call [phone] 05/21/2016, 3:27 PM  Patient seen and independently examined with Cecilie Kicks, NP. We discussed all aspects of the encounter. I agree with the assessment and plan as stated above.  Patient with no prior cardiac history with recent racoon bite s/p rabies vaccine treatments in August and since then has not felt well.  Has had SOB and chest discomfort off and on and he though it was related to the racoon bite but symptoms persisted and have gotten worse.  He noticed palpitations as well.  Came to ER and was in atrial  flutter with RVR and started on IV Diltiazem.  WBC noted at 20.4 and normal TSH.  Chest CT showed left lowe segment PE with probable pulmonary infarct and moderate pericardial effusion.  Echo showed mild LV dysfunction with EF 45-50% ? Secondary to tachycardia induced CM from atrial flutter of unknown duration.  He was started on IV Heparin for the acute PE and atrial flutter.  Echo also showed a moderate pericardial effusion with fibrin strands over RV apex but in the epigastric view there was a significant effusion posteriorly with possible clot.  The pericardium over the RV free wall appears very bright as well. ? Whether he had acute pericarditis several months ago and now has developed an effusive constrictive process vs. Bleeding into pericardium after starting Heparin.  Ultimately would benefit from cardiac MRI but need to get HR slowed down first.  He needs to be  anticoagulated due to his acute PE.  Will keep on Heparin for now and repeat echo in am to reassess effusion.  Check CRP, sed rate.  Recommend ID consult given symptoms of fever and chills for a while now and increased WBC.    I have spent a total of 60 minutes with patient reviewing chest CT images, echo images , telemetry, EKGs, labs and examining patient as well as establishing an assessment and plan that was discussed with the patient.  > 50% of time was spent in direct patient care.    Signed: Fransico Him, MD [facility] 05/21/2016

## 2016-05-21 NOTE — Progress Notes (Signed)
[place] for heparin Indication: pulmonary embolus  No Known Allergies  Patient Measurements: Height: 6\' 2"  (188 cm) (per pt report) Weight: 167 lb (75.8 kg) (per pt report) IBW/kg (Calculated) : 82.2 Heparin Dosing Weight: 75.8 kg  Vital Signs: Temp: 97.9 F (36.6 C) (11/15 0815) Temp Source: Oral (11/15 0815) BP: 102/74 (11/15 0815) Pulse Rate: 124 (11/15 0815)  Labs:  Recent Labs  05/20/16 1450 05/20/16 2100 05/20/16 2332 05/21/16 0713  HGB 13.1  --   --  11.1*  HCT 39.5  --   --  34.2*  PLT 390  --   --  319  APTT  --  51*  --   --   LABPROT  --  17.2*  --   --   INR  --  1.40  --   --   HEPARINUNFRC  --   --  <0.10* <0.10*  CREATININE 0.97  --   --  0.68    Estimated Creatinine Clearance: 127.6 mL/min (by C-G formula based on SCr of 0.68 mg/dL).  Assessment: 45 y.o. male with PE and afib on heparin and the heparin level remains below goal on 1500 units/hr.  -Hg= 13.1>>11.2 (likely dilutional), plt= 319  Goal of Therapy:  Heparin level 0.3-0.7 units/ml Monitor platelets by anticoagulation protocol: Yes   Plan:  -Heparin 3000 units IV bolus, then increase heparin 1800 units/hr -Heparin level in 6 hours and daily wth CBC daily -If no plans for procedures, consider change to lovenox or apixiban/rivaroxaban?  Hildred Laser, Pharm D 05/21/2016 9:03 AM

## 2016-05-22 ENCOUNTER — Ambulatory Visit (HOSPITAL_COMMUNITY): Payer: BLUE CROSS/BLUE SHIELD

## 2016-05-22 DIAGNOSIS — I3139 Other pericardial effusion (noninflammatory): Secondary | ICD-10-CM

## 2016-05-22 DIAGNOSIS — I313 Pericardial effusion (noninflammatory): Secondary | ICD-10-CM

## 2016-05-22 DIAGNOSIS — I319 Disease of pericardium, unspecified: Secondary | ICD-10-CM

## 2016-05-22 DIAGNOSIS — I2609 Other pulmonary embolism with acute cor pulmonale: Secondary | ICD-10-CM

## 2016-05-22 LAB — BASIC METABOLIC PANEL
ANION GAP: 8 (ref 5–15)
BUN: 7 mg/dL (ref 6–20)
CHLORIDE: 105 mmol/L (ref 101–111)
CO2: 26 mmol/L (ref 22–32)
Calcium: 8.4 mg/dL — ABNORMAL LOW (ref 8.9–10.3)
Creatinine, Ser: 0.83 mg/dL (ref 0.61–1.24)
GFR calc Af Amer: >60 mL/min (ref >60)
GFR calc non Af Amer: >60 mL/min (ref >60)
Glucose, Bld: 96 mg/dL (ref 65–99)
POTASSIUM: 3.7 mmol/L (ref 3.5–5.1)
SODIUM: 139 mmol/L (ref 135–145)

## 2016-05-22 LAB — ECHOCARDIOGRAM LIMITED
Height: 74 in
WEIGHTICAEL: 2672 [oz_av]

## 2016-05-22 LAB — HEPARIN LEVEL (UNFRACTIONATED)
HEPARIN UNFRACTIONATED: 0.4 [IU]/mL (ref 0.30–0.70)
HEPARIN UNFRACTIONATED: 0.23 [IU]/mL — AB (ref 0.30–0.70)
Heparin Unfractionated: 0.2 IU/mL — ABNORMAL LOW (ref 0.30–0.70)
Heparin Unfractionated: 0.24 IU/mL — ABNORMAL LOW (ref 0.30–0.70)

## 2016-05-22 LAB — SEDIMENTATION RATE: SED RATE: 55 mm/h — AB (ref 0–16)

## 2016-05-22 LAB — CBC
HCT: 34.6 % — ABNORMAL LOW (ref 39.0–52.0)
Hemoglobin: 11.4 g/dL — ABNORMAL LOW (ref 13.0–17.0)
MCH: 29.6 pg (ref 26.0–34.0)
MCHC: 32.9 g/dL (ref 30.0–36.0)
MCV: 89.9 fL (ref 78.0–100.0)
PLATELETS: 283 10*3/uL (ref 150–400)
RBC: 3.85 MIL/uL — ABNORMAL LOW (ref 4.22–5.81)
RDW: 14.3 % (ref 11.5–15.5)
WBC: 14.8 10*3/uL — AB (ref 4.0–10.5)

## 2016-05-22 LAB — B. BURGDORFI ANTIBODIES

## 2016-05-22 LAB — C-REACTIVE PROTEIN: CRP: 22.5 mg/dL — AB (ref <1.0)

## 2016-05-22 MED ORDER — METOPROLOL TARTRATE 25 MG PO TABS
37.5000 mg | ORAL_TABLET | Freq: Two times a day (BID) | ORAL | Status: DC
  Administered 2016-05-22 – 2016-05-26 (×9): 37.5 mg via ORAL
  Filled 2016-05-22 (×9): qty 1

## 2016-05-22 MED ORDER — METOPROLOL TARTRATE 50 MG PO TABS: 50.0000 mg | ORAL_TABLET | Freq: Two times a day (BID) | ORAL | Status: DC

## 2016-05-22 MED ORDER — METOPROLOL TARTRATE 25 MG PO TABS: 37.5000 mg | ORAL_TABLET | Freq: Two times a day (BID) | ORAL | Status: DC

## 2016-05-22 MED ORDER — HEPARIN BOLUS VIA INFUSION
1000.0000 [IU] | Freq: Once | INTRAVENOUS | Status: AC
  Administered 2016-05-22: 1000 [IU] via INTRAVENOUS
  Filled 2016-05-22: qty 1000

## 2016-05-22 MED ORDER — HEPARIN BOLUS VIA INFUSION
1000.0000 [IU] | Freq: Once | INTRAVENOUS | Status: AC
  Administered 2016-05-23: 1000 [IU] via INTRAVENOUS
  Filled 2016-05-22: qty 1000

## 2016-05-22 MED ORDER — COLCHICINE 0.6 MG PO TABS
0.6000 mg | ORAL_TABLET | Freq: Two times a day (BID) | ORAL | Status: DC
  Administered 2016-05-22 – 2016-05-26 (×9): 0.6 mg via ORAL
  Filled 2016-05-22 (×9): qty 1

## 2016-05-22 MED ORDER — HEPARIN BOLUS VIA INFUSION
3000.0000 [IU] | Freq: Once | INTRAVENOUS | Status: AC
  Administered 2016-05-22: 3000 [IU] via INTRAVENOUS
  Filled 2016-05-22: qty 3000

## 2016-05-22 NOTE — Progress Notes (Addendum)
Patient Name: Carl Bush Date of Encounter: 05/22/2016  Primary Cardiologist: Dr. Sundra Aland Problem List     Principal Problem:   Pulmonary embolism [facility]) Active Problems:   Sepsis ([place])   Tobacco use disorder   Alcohol dependence ([place])   Atrial flutter ([place])   Tachycardia   Effusion, pericardium     Subjective   Feels better today.  Inpatient Medications    Scheduled Meds: . folic acid  1 mg Oral Daily  . mouth rinse  15 mL Mouth Rinse BID  . metoprolol tartrate  37.5 mg Oral BID  . multivitamin with minerals  1 tablet Oral Daily  . nicotine  21 mg Transdermal Daily  . piperacillin-tazobactam (ZOSYN)  IV  3.375 g Intravenous Q8H  . sodium chloride flush  3 mL Intravenous Q12H  . thiamine  100 mg Oral Daily   Or  . thiamine  100 mg Intravenous Daily  . vancomycin  1,000 mg Intravenous Q8H   Continuous Infusions: . heparin 2,250 Units/hr (05/22/16 0630)   PRN Meds: acetaminophen, LORazepam **OR** LORazepam, morphine injection, ondansetron **OR** ondansetron (ZOFRAN) IV   Vital Signs    Vitals:   05/22/16 0400 05/22/16 0500 05/22/16 0600 05/22/16 0800  BP: 106/79 103/76 (!) 115/95 128/86  Pulse: (!) 127 (!) 115 (!) 127 (!) 131  Resp: (!) 27 20 (!) 25 16  Temp:    98.7 F (37.1 C)  TempSrc:    Oral  SpO2: 94% 95% 96% 94%  Weight:      Height:        Intake/Output Summary (Last 24 hours) at 05/22/16 0916 Last data filed at 05/22/16 0600  Gross per 24 hour  Intake          1217.43 ml  Output             4400 ml  Net         -3182.57 ml   Filed Weights   05/20/16 1700  Weight: 167 lb (75.8 kg)    Physical Exam    GEN: Well nourished, well developed, in no acute distress.  HEENT: Grossly normal.  Neck: Supple, no JVD, carotid bruits, or masses. Cardiac: RRR tachy, no murmurs, rubs, or gallops. No clubbing, cyanosis, edema.  Radials/DP/PT 2+ and equal bilaterally.  Respiratory:  Respirations regular and unlabored, clear to  auscultation bilaterally. GI: Soft, nontender, nondistended, BS + x 4. MS: no deformity or atrophy. Skin: warm and dry, no rash. Neuro:  Strength and sensation are intact. Psych: AAOx3.  Normal affect.  Labs    CBC  Recent Labs  05/20/16 1450 05/21/16 0713  WBC 20.4* 18.9*  NEUTROABS 16.2*  --   HGB 13.1 11.1*  HCT 39.5 34.2*  MCV 90.8 89.8  PLT 390 [id number]   Basic Metabolic Panel  Recent Labs  05/21/16 0713 05/21/16 2333  NA 136 139  K 3.7 3.7  CL 105 105  CO2 23 26  GLUCOSE 102* 96  BUN 9 7  CREATININE 0.68 0.83  CALCIUM 8.0* 8.4*   Liver Function Tests  Recent Labs  05/20/16 1450  AST 37  ALT 25  ALKPHOS 108  BILITOT 0.7  PROT 6.8  ALBUMIN 3.2*   No results for input(s): LIPASE, AMYLASE in the last 72 hours. Cardiac Enzymes No results for input(s): CKTOTAL, CKMB, CKMBINDEX, TROPONINI in the last 72 hours. BNP Invalid input(s): POCBNP D-Dimer No results for input(s): DDIMER in the last 72 hours. Hemoglobin A1C No results for input(s):  HGBA1C in the last 72 hours. Fasting Lipid Panel No results for input(s): CHOL, HDL, LDLCALC, TRIG, CHOLHDL, LDLDIRECT in the last 72 hours. Thyroid Function Tests  Recent Labs  05/20/16 1450  TSH 2.007    Telemetry     - Personally Reviewed  ECG     - Personally Reviewed  Radiology    Ct Angio Chest Pe W Or Wo Contrast  Result Date: 05/20/2016 CLINICAL DATA:  45 y/o M; upper chest pain and shortness of breath. History of smoking. EXAM: CT ANGIOGRAPHY CHEST WITH CONTRAST TECHNIQUE: Multidetector CT imaging of the chest was performed using the standard protocol during bolus administration of intravenous contrast. Multiplanar CT image reconstructions and MIPs were obtained to evaluate the vascular anatomy. CONTRAST:  70 cc Isovue 370 COMPARISON:  None. FINDINGS: Cardiovascular: Satisfactory opacification of pulmonary arteries to the segmental level. Multiple segmental pulmonary arteries of the left lung base  are occluded consistent with pulmonary embolus. No evidence for right heart strain. Normal heart size. Moderate pericardial effusion. Mild coronary artery calcifications. Normal caliber main pulmonary artery. Normal caliber thoracic aorta. Mediastinum/Nodes: Prominent subcentimeter mediastinal lymph nodes are likely reactive. Thyroid gland, trachea, and esophagus demonstrate no significant findings. Lungs/Pleura: Mild centrilobular emphysema. Ill-defined ground-glass opacity at the left lung base in region of multiple occluded segmental pulmonary emboli. Small left-sided pleural effusion. Upper Abdomen: Gastrohepatic ligament lymphadenopathy of uncertain significance. Possible periportal edema. Musculoskeletal: No chest wall abnormality. No acute or significant osseous findings. Review of the MIP images confirms the above findings. IMPRESSION: 1. Left lower lobe segmental pulmonary emboli. 2. Ground-glass opacity in the left lower lobe in region of pulmonary emboli is probably a developing pulmonary infarct, less likely pneumonia. 3. Small left-sided pleural effusion. 4. Moderate pericardial effusion. 5. Gastrohepatic ligament lymphadenopathy and possible periportal edema in the liver of uncertain significance, partially visualized. These results were called by telephone at the time of interpretation on 05/20/2016 at 4:31 pm to Dr. Vanita Panda, who verbally acknowledged these results. Electronically Signed   By: Kristine Garbe M.D.   On: 05/20/2016 16:35   Dg Chest Port 1 View  Result Date: 05/20/2016 CLINICAL DATA:  Increasing chest pain and shortness of breath, fluttering in chest over last couple weeks, smoker EXAM: PORTABLE CHEST 1 VIEW COMPARISON:  Portable exam 1501 hours without priors for comparison. FINDINGS: Minimal enlargement of cardiac silhouette. Mediastinal contours and pulmonary vascularity normal. Suspicion of retrocardiac LEFT lower lobe infiltrate with loss of the LEFT diaphragmatic  silhouette. Remaining lungs clear. No pleural effusion or pneumothorax. Bones unremarkable. IMPRESSION: Minimal enlargement of cardiac silhouette. Question LEFT lower lobe consolidation/pneumonia. Electronically Signed   By: Lavonia Dana M.D.   On: 05/20/2016 15:19    Cardiac Studies   Study Conclusions  - Left ventricle: The cavity size was normal. There was   mild-moderate concentric hypertrophy. Systolic function was   mildly reduced. The estimated ejection fraction was in the range   of 45% to 50%. Diffuse hypokinesis. - Aortic valve: Transvalvular velocity was within the normal range.   There was no stenosis. There was no regurgitation. - Mitral valve: Transvalvular velocity was within the normal range.   There was no evidence for stenosis. There was no regurgitation. - Right atrium: The atrium was moderately dilated. - Atrial septum: No defect or patent foramen ovale was identified   by color flow Doppler. - Tricuspid valve: There was mild regurgitation. - Pulmonary arteries: Systolic pressure was within the normal   range. PA peak pressure: 14 mm Hg (  S). - Pericardium, extracardiac: A moderate pericardial effusion was   identified. The fluid contained focal strands near the RV apex   and possible thrombus concerning for hemopericadium. There was   mildright ventricular chamber collapse. Respirophasic change in   stroke volume was normal. Features were consistent with early   tamponade physiology.  Patient Profile     Patient with no prior cardiac history with recent racoon bite s/p rabies vaccine treatments in August and since then has not felt well.  Has had SOB and chest discomfort off and on and he though it was related to the racoon bite but symptoms persisted and have gotten worse.  He noticed palpitations as well.  Came to ER and was in atrial flutter with RVR and started on IV Diltiazem.  WBC noted at 20.4 and normal TSH.  Chest CT showed left lowe segment PE with probable  pulmonary infarct and moderate pericardial effusion.  Echo showed mild LV dysfunction with EF 45-50% ? Secondary to tachycardia induced CM from atrial flutter of unknown duration  Assessment & Plan    1. Pericardial effusion-mild to mod.  I suspect he has acute pericarditis from viral syndrome.  Reviewed echo and CT images.  I do not think there is clot in the pericardium.  This is likely infarcted lung behind the heart.  He is at higher risk of converting to hemopericardium as he has to be on Heparin for PE so will follow with serial echo.  Repeat limited echo today. Will start Colchicine 0.6mg  BID.  Avoid NSAIDS as he is on heparin.  2. Typical A flutter RVR with abnormal EKG difficult to eval ST due to flutter waves -on lopressor 25 BID but HR not well controlled.  In 130's this am.  Will increase Lopressor to 37.5g BID.  May need to start on Cardizem gtt for better rate control.  Would not consider TEE/DCCV at this time as he would likely revert back to aflutter given underlying PE and pericarditis.  3. Pulmonary embolism no rt heart strain.  On IV heparin   4.  Sepsis on abx- followed by FP.  WBC trending downward and likely related to PE and acute pericarditis.  Blood cx neg thus far.  I have spent a total of 35 minutes with patient reviewing CT and echo images , telemetry, EKGs, labs and examining patient as well as establishing an assessment and plan that was discussed with the patient.  > 50% of time was spent in direct patient care.   Signed, Fransico Him, MD  05/22/2016, 9:16 AM

## 2016-05-22 NOTE — Progress Notes (Signed)
ANTICOAGULATION CONSULT NOTE - Follow Up Consult  Pharmacy Consult for heparin Indication: pulmonary embolus   No Known Allergies  Patient Measurements: Height: 6\' 2"  (188 cm) (per pt report) Weight: 167 lb (75.8 kg) (per pt report) IBW/kg (Calculated) : 82.2 Heparin Dosing Weight: 75.8 kg  Vital Signs: Temp: 98.4 F (36.9 C) (11/16 1146) Temp Source: Oral (11/16 1146) BP: 106/77 (11/16 1146) Pulse Rate: 130 (11/16 1146)  Labs:  Recent Labs  05/20/16 1450 05/20/16 2100  05/21/16 0713  05/21/16 2333 05/22/16 0802 05/22/16 1310  HGB 13.1  --   --  11.1*  --   --  11.4*  --   HCT 39.5  --   --  34.2*  --   --  34.6*  --   PLT 390  --   --  319  --   --  283  --   APTT  --  51*  --   --   --   --   --   --   LABPROT  --  17.2*  --   --   --   --   --   --   INR  --  1.40  --   --   --   --   --   --   HEPARINUNFRC  --   --   < > <0.10*  < > 0.23* 0.40 0.20*  CREATININE 0.97  --   --  0.68  --  0.83  --   --   < > = values in this interval not displayed.  Estimated Creatinine Clearance: 123 mL/min (by C-G formula based on SCr of 0.83 mg/dL).   Assessment: MC is a 45 yo male who presented on 11/14 with chest pain and shortness of breath. He is currently being treated with heparin for a pulmonary embolus and a-flutter. Of note, his Echo on 11/15 was concerning for a clot in the pericardium and conversion to hemopericardium. Repeat echo from 11/16 shows smaller area of effusion. It is suspected that he also has acute pericarditis from a viral syndrome.   His heparin level is subtherapeutic this afternoon at 0.20 on 2250 units/hr. CBC is stable. Nurse reports no issues with bleeding or the line.    Goal of Therapy:  Heparin level 0.3-0.7 units/ml Monitor platelets by anticoagulation protocol: Yes   Plan:  Heparin bolus X 1000 units  Increase heparin to  2300 units/hr. 6 hour heparin level Daily heparin level and CBC Monitor repeat ECHO Monitor for signs and symptoms  of bleeding    Ihor Austin, PharmD PGY1 Pharmacy Resident Pager: [phone]  05/22/2016,2:44 PM

## 2016-05-22 NOTE — Progress Notes (Addendum)
PROGRESS NOTE    Carl Bush  [id number] DOB: 08-28-1970 DOA: 05/20/2016 PCP: No primary care provider on file.  Brief Narrative: Carl Bush is a 45 y.o. male with no significant past medical history presented with chest pain, SOB. Since September, he has noticed upper chest pain with any exertion.   11/13 night, his legs spasmed and he got dizzy, light-headed, chest pain, SOB - had to drop onto the kitchen floor to recover. Fever to 100.5 in ER, thinks he also has been febrile at home for especially the last 2 nights. Thinks he has a problem with ETOH.  20+ year history, drinks 12 pack QOD. Found to have Acute PE and Mod pericardial effusion, Afib/flutter RVR, Cards consulting  Assessment & Plan:   Pulmonary embolism -acute/subacute pulmonary embolism with pulmonary infarct, lower segmental PE -Continue IV heparin day 2 -ABG with respiratory alkalosis -CT angiogram with moderate pericardial effusion andEKG as well as rhythm concerning for atrial flutter -continue metoprolol, 2-D  Echocardiogram without R heart strain but with mod pericardial effusion  Sepsis -fever, tachypnea, leukocytosis likely related to PE/ pulmonary infarcts -Stop vancomycin, continue Zosyn for 24-48hours more -HIV-negative  Moderate pericardial effusion with early tamponade --likely viral in etiology - repeat 2-D echo today -Cards following, can hold Heparin temporarily for Pericardiocentesis if needed -Cards following, added Colchicine today  Atrial flutter/fib with RVR -Likely due to PE -increase dose of metoprolol -On full dose anticoagulation  Tobacco dependence -Encouraged cessation.    Alcohol dependence -Acknowledges problem but does not desire to quit at this time. -monitor on CIWA protocol, no withdrawal yet  DVT prophylaxis: Heparin drip Code Status:  Full  Family Communication: None present  Disposition Plan: keep in stepdown unit, may need transfer to ICU if  declines  Consultants:   Cards   Subjective: Feels better, still with dyspnea  Objective: Vitals:   05/22/16 0500 05/22/16 0600 05/22/16 0800 05/22/16 1146  BP: 103/76 (!) 115/95 128/86 106/77  Pulse: (!) 115 (!) 127 (!) 131 (!) 130  Resp: 20 (!) 25 16 15   Temp:   98.7 F (37.1 C) 98.4 F (36.9 C)  TempSrc:   Oral Oral  SpO2: 95% 96% 94% 97%  Weight:      Height:        Intake/Output Summary (Last 24 hours) at 05/22/16 1356 Last data filed at 05/22/16 0600  Gross per 24 hour  Intake           976.43 ml  Output             3500 ml  Net         -2523.57 ml   Filed Weights   05/20/16 1700  Weight: 75.8 kg (167 lb)    Examination:  General exam: Appears calm and comfortable, AAO 3 Respiratory system: scant basilar ronchi. Cardiovascular system: S1 & S2 heard,tachycardic, irregular Gastrointestinal system: Abdomen is nondistended, soft and nontender.   Central nervous system: Alert and oriented. No focal neurological deficits. Extremities: Symmetric 5 x 5 power. Skin: No rashes, lesions or ulcers Psychiatry: Judgement and insight appear normal. Mood & affect appropriate.     Data Reviewed: I have personally reviewed following labs and imaging studies  CBC:  Recent Labs Lab 05/20/16 1450 05/21/16 0713 05/22/16 0802  WBC 20.4* 18.9* 14.8*  NEUTROABS 16.2*  --   --   HGB 13.1 11.1* 11.4*  HCT 39.5 34.2* 34.6*  MCV 90.8 89.8 89.9  PLT 390 319 [id number]   Basic Metabolic Panel:  Recent Labs Lab 05/20/16 1450 05/21/16 0713 05/21/16 2333  NA 135 136 139  K 3.9 3.7 3.7  CL 102 105 105  CO2 23 23 26   GLUCOSE 138* 102* 96  BUN 14 9 7   CREATININE 0.97 0.68 0.83  CALCIUM 8.8* 8.0* 8.4*   GFR: Estimated Creatinine Clearance: 123 mL/min (by C-G formula based on SCr of 0.83 mg/dL). Liver Function Tests:  Recent Labs Lab 05/20/16 1450  AST 37  ALT 25  ALKPHOS 108  BILITOT 0.7  PROT 6.8  ALBUMIN 3.2*   No results for input(s): LIPASE, AMYLASE in  the last 168 hours. No results for input(s): AMMONIA in the last 168 hours. Coagulation Profile:  Recent Labs Lab 05/20/16 2100  INR 1.40   Cardiac Enzymes: No results for input(s): CKTOTAL, CKMB, CKMBINDEX, TROPONINI in the last 168 hours. BNP (last 3 results) No results for input(s): PROBNP in the last 8760 hours. HbA1C: No results for input(s): HGBA1C in the last 72 hours. CBG:  Recent Labs Lab 05/20/16 1423  GLUCAP 154*   Lipid Profile: No results for input(s): CHOL, HDL, LDLCALC, TRIG, CHOLHDL, LDLDIRECT in the last 72 hours. Thyroid Function Tests:  Recent Labs  05/20/16 1450  TSH 2.007  FREET4 1.02   Anemia Panel: No results for input(s): VITAMINB12, FOLATE, FERRITIN, TIBC, IRON, RETICCTPCT in the last 72 hours. Urine analysis:    Component Value Date/Time   COLORURINE AMBER (A) 05/21/2016 0100   APPEARANCEUR CLEAR 05/21/2016 0100   LABSPEC >1.046 (H) 05/21/2016 0100   PHURINE 6.0 05/21/2016 0100   GLUCOSEU NEGATIVE 05/21/2016 0100   HGBUR MODERATE (A) 05/21/2016 0100   BILIRUBINUR NEGATIVE 05/21/2016 0100   KETONESUR NEGATIVE 05/21/2016 0100   PROTEINUR 30 (A) 05/21/2016 0100   NITRITE NEGATIVE 05/21/2016 0100   LEUKOCYTESUR NEGATIVE 05/21/2016 0100   Sepsis Labs: @LABRCNTIP (procalcitonin:4,lacticidven:4)  ) Recent Results (from the past 240 hour(s))  Culture, blood (Routine X 2) w Reflex to ID Panel     Status: None (Preliminary result)   Collection Time: 05/20/16  2:50 PM  Result Value Ref Range Status   Specimen Description BLOOD RIGHT FOREARM  Final   Special Requests BOTTLES DRAWN AEROBIC AND ANAEROBIC 5CC  Final   Culture NO GROWTH 1 DAY  Final   Report Status PENDING  Incomplete  Culture, blood (Routine X 2) w Reflex to ID Panel     Status: None (Preliminary result)   Collection Time: 05/20/16  3:25 PM  Result Value Ref Range Status   Specimen Description BLOOD RIGHT HAND  Final   Special Requests IN PEDIATRIC BOTTLE 3CC  Final    Culture NO GROWTH 1 DAY  Final   Report Status PENDING  Incomplete  MRSA PCR Screening     Status: None   Collection Time: 05/20/16  8:44 PM  Result Value Ref Range Status   MRSA by PCR NEGATIVE NEGATIVE Final    Comment:        The GeneXpert MRSA Assay (FDA approved for NASAL specimens only), is one component of a comprehensive MRSA colonization surveillance program. It is not intended to diagnose MRSA infection nor to guide or monitor treatment for MRSA infections.          Radiology Studies: Ct Angio Chest Pe W Or Wo Contrast  Result Date: 05/20/2016 CLINICAL DATA:  45 y/o M; upper chest pain and shortness of breath. History of smoking. EXAM: CT ANGIOGRAPHY CHEST WITH CONTRAST TECHNIQUE: Multidetector CT imaging of the chest was performed using the  standard protocol during bolus administration of intravenous contrast. Multiplanar CT image reconstructions and MIPs were obtained to evaluate the vascular anatomy. CONTRAST:  70 cc Isovue 370 COMPARISON:  None. FINDINGS: Cardiovascular: Satisfactory opacification of pulmonary arteries to the segmental level. Multiple segmental pulmonary arteries of the left lung base are occluded consistent with pulmonary embolus. No evidence for right heart strain. Normal heart size. Moderate pericardial effusion. Mild coronary artery calcifications. Normal caliber main pulmonary artery. Normal caliber thoracic aorta. Mediastinum/Nodes: Prominent subcentimeter mediastinal lymph nodes are likely reactive. Thyroid gland, trachea, and esophagus demonstrate no significant findings. Lungs/Pleura: Mild centrilobular emphysema. Ill-defined ground-glass opacity at the left lung base in region of multiple occluded segmental pulmonary emboli. Small left-sided pleural effusion. Upper Abdomen: Gastrohepatic ligament lymphadenopathy of uncertain significance. Possible periportal edema. Musculoskeletal: No chest wall abnormality. No acute or significant osseous findings.  Review of the MIP images confirms the above findings. IMPRESSION: 1. Left lower lobe segmental pulmonary emboli. 2. Ground-glass opacity in the left lower lobe in region of pulmonary emboli is probably a developing pulmonary infarct, less likely pneumonia. 3. Small left-sided pleural effusion. 4. Moderate pericardial effusion. 5. Gastrohepatic ligament lymphadenopathy and possible periportal edema in the liver of uncertain significance, partially visualized. These results were called by telephone at the time of interpretation on 05/20/2016 at 4:31 pm to Dr. Vanita Panda, who verbally acknowledged these results. Electronically Signed   By: Kristine Garbe M.D.   On: 05/20/2016 16:35   Dg Chest Port 1 View  Result Date: 05/20/2016 CLINICAL DATA:  Increasing chest pain and shortness of breath, fluttering in chest over last couple weeks, smoker EXAM: PORTABLE CHEST 1 VIEW COMPARISON:  Portable exam 1501 hours without priors for comparison. FINDINGS: Minimal enlargement of cardiac silhouette. Mediastinal contours and pulmonary vascularity normal. Suspicion of retrocardiac LEFT lower lobe infiltrate with loss of the LEFT diaphragmatic silhouette. Remaining lungs clear. No pleural effusion or pneumothorax. Bones unremarkable. IMPRESSION: Minimal enlargement of cardiac silhouette. Question LEFT lower lobe consolidation/pneumonia. Electronically Signed   By: Lavonia Dana M.D.   On: 05/20/2016 15:19        Scheduled Meds: . colchicine  0.6 mg Oral BID  . folic acid  1 mg Oral Daily  . mouth rinse  15 mL Mouth Rinse BID  . metoprolol tartrate  37.5 mg Oral BID  . multivitamin with minerals  1 tablet Oral Daily  . nicotine  21 mg Transdermal Daily  . piperacillin-tazobactam (ZOSYN)  IV  3.375 g Intravenous Q8H  . sodium chloride flush  3 mL Intravenous Q12H  . thiamine  100 mg Oral Daily   Or  . thiamine  100 mg Intravenous Daily  . vancomycin  1,000 mg Intravenous Q8H   Continuous Infusions: .  heparin 2,250 Units/hr (05/22/16 0630)     LOS: 2 days    Time spent: 60min    Domenic Polite, MD [facility] Pager [phone]  If 7PM-7AM, please contact night-coverage [email or web address] Password [email or web address] 05/22/2016, 1:56 PM

## 2016-05-22 NOTE — Progress Notes (Signed)
ANTICOAGULATION CONSULT NOTE - Follow Up Consult  Pharmacy Consult for heparin Indication: pulmonary embolus   No Known Allergies  Patient Measurements: Height: 6\' 2"  (188 cm) (per pt report) Weight: 167 lb (75.8 kg) (per pt report) IBW/kg (Calculated) : 82.2 Heparin Dosing Weight: 75.8 kg  Vital Signs: Temp: 98.7 F (37.1 C) (11/16 0800) Temp Source: Oral (11/16 0800) BP: 128/86 (11/16 0800) Pulse Rate: 131 (11/16 0800)  Labs:  Recent Labs  05/20/16 1450 05/20/16 2100  05/21/16 0713 05/21/16 1535 05/21/16 2333 05/22/16 0802  HGB 13.1  --   --  11.1*  --   --  11.4*  HCT 39.5  --   --  34.2*  --   --  34.6*  PLT 390  --   --  319  --   --  283  APTT  --  51*  --   --   --   --   --   LABPROT  --  17.2*  --   --   --   --   --   INR  --  1.40  --   --   --   --   --   HEPARINUNFRC  --   --   < > <0.10* 0.19* 0.23* 0.40  CREATININE 0.97  --   --  0.68  --  0.83  --   < > = values in this interval not displayed.  Estimated Creatinine Clearance: 123 mL/min (by C-G formula based on SCr of 0.83 mg/dL).   Assessment: Carl Bush is a 45 yo male who presented on 11/14 with chest pain and shortness of breath. He is currently being treated with heparin for a pulmonary embolus and a-flutter. Of note, his Echo on 11/16 was concerning for a clot in the pericardium and conversion to hemopericardium. It is suspected that he also has acute pericarditis from a viral syndrome. His heparin level this AM is therapeutic at 0.40 on 2250 units/hr. H/H is stable and platelets are slightly down. No signs of bleeding noted.   Goal of Therapy:  Heparin level 0.3-0.7 units/ml Monitor platelets by anticoagulation protocol: Yes   Plan:  Continue heparin at 2250 units/hr. 6 hour confirmatory heparin level Daily heparin level and CBC Monitor repeat ECHO Monitor for signs and symptoms of bleeding    Ihor Austin, PharmD PGY1 Pharmacy Resident Pager: [phone]  05/22/2016,10:25 AM

## 2016-05-22 NOTE — Progress Notes (Signed)
ANTICOAGULATION CONSULT NOTE - Follow Up Consult  Pharmacy Consult for heparin Indication: pulmonary embolus   No Known Allergies  Patient Measurements: Height: 6\' 2"  (188 cm) (per pt report) Weight: 167 lb (75.8 kg) (per pt report) IBW/kg (Calculated) : 82.2 Heparin Dosing Weight: 75.8 kg  Vital Signs: Temp: 97.7 F (36.5 C) (11/16 2001) Temp Source: Oral (11/16 2001) BP: 115/93 (11/16 1800) Pulse Rate: 130 (11/16 1800)  Labs:  Recent Labs  05/20/16 1450 05/20/16 2100  05/21/16 0713  05/21/16 2333 05/22/16 0802 05/22/16 1310 05/22/16 2125  HGB 13.1  --   --  11.1*  --   --  11.4*  --   --   HCT 39.5  --   --  34.2*  --   --  34.6*  --   --   PLT 390  --   --  319  --   --  283  --   --   APTT  --  51*  --   --   --   --   --   --   --   LABPROT  --  17.2*  --   --   --   --   --   --   --   INR  --  1.40  --   --   --   --   --   --   --   HEPARINUNFRC  --   --   < > <0.10*  < > 0.23* 0.40 0.20* 0.24*  CREATININE 0.97  --   --  0.68  --  0.83  --   --   --   < > = values in this interval not displayed.  Estimated Creatinine Clearance: 123 mL/min (by C-G formula based on SCr of 0.83 mg/dL).   Assessment: MC is a 45 yo male who presented on 11/14 with chest pain and shortness of breath. He is currently being treated with heparin for a pulmonary embolus and a-flutter. Of note, his Echo on 11/15 was concerning for a clot in the pericardium and conversion to hemopericardium. Repeat echo from 11/16 shows smaller area of effusion. It is suspected that he also has acute pericarditis from a viral syndrome.   The patient's heparin level remains SUBtherapeutic this evening (HL 0.24 << 0.2, goal of 0.3-0.7). Hgb 11.4, plts wnl. Noted concern for patient being high risk for converting to hemopericardium and getting ECHO evaluation.  Goal of Therapy:  Heparin level 0.3-0.7 units/ml Monitor platelets by anticoagulation protocol: Yes   Plan:  1. Heparin bolus of 1000 units x  1 2. Increase Heparin to 2450 units/hr (24.5 ml/hr) 3. Will continue to monitor for any signs/symptoms of bleeding and will follow up with heparin level in 6 hours   Thank you for allowing pharmacy to be a part of this patient's care.  Alycia Rossetti, PharmD, BCPS Clinical Pharmacist Pager: [phone] 05/22/2016 10:10 PM

## 2016-05-22 NOTE — Progress Notes (Signed)
[place] for heparin Indication: pulmonary embolus  No Known Allergies  Patient Measurements: Height: 6\' 2"  (188 cm) (per pt report) Weight: 167 lb (75.8 kg) (per pt report) IBW/kg (Calculated) : 82.2 Heparin Dosing Weight: 75.8 kg  Vital Signs: Temp: 98.9 F (37.2 C) (11/15 2300) Temp Source: Oral (11/15 2300) BP: 112/77 (11/16 0000) Pulse Rate: 121 (11/16 0000)  Labs:  Recent Labs  05/20/16 1450 05/20/16 2100  05/21/16 0713 05/21/16 1535 05/21/16 2333  HGB 13.1  --   --  11.1*  --   --   HCT 39.5  --   --  34.2*  --   --   PLT 390  --   --  319  --   --   APTT  --  51*  --   --   --   --   LABPROT  --  17.2*  --   --   --   --   INR  --  1.40  --   --   --   --   HEPARINUNFRC  --   --   < > <0.10* 0.19* 0.23*  CREATININE 0.97  --   --  0.68  --   --   < > = values in this interval not displayed.  Estimated Creatinine Clearance: 127.6 mL/min (by C-G formula based on SCr of 0.68 mg/dL).  Assessment: 45 y.o. male with PE and Aflutter for heparin  Goal of Therapy:  Heparin level 0.3-0.7 units/ml Monitor platelets by anticoagulation protocol: Yes   Plan:  Heparin 3000 units IV bolus, then increase heparin 2250 units/hr Follow-up am labs.   Phillis Knack, PharmD, BCPS  05/22/2016 1:12 AM

## 2016-05-22 NOTE — Progress Notes (Signed)
  Echocardiogram 2D Echocardiogram Limited has been performed.  Darlina Sicilian M 05/22/2016, 10:56 AM

## 2016-05-23 DIAGNOSIS — I420 Dilated cardiomyopathy: Secondary | ICD-10-CM

## 2016-05-23 LAB — CBC
HCT: 33.6 % — ABNORMAL LOW (ref 39.0–52.0)
HEMOGLOBIN: 11.1 g/dL — AB (ref 13.0–17.0)
MCH: 29.3 pg (ref 26.0–34.0)
MCHC: 33 g/dL (ref 30.0–36.0)
MCV: 88.7 fL (ref 78.0–100.0)
PLATELETS: 358 10*3/uL (ref 150–400)
RBC: 3.79 MIL/uL — AB (ref 4.22–5.81)
RDW: 13.7 % (ref 11.5–15.5)
WBC: 13 10*3/uL — AB (ref 4.0–10.5)

## 2016-05-23 LAB — BASIC METABOLIC PANEL
ANION GAP: 9 (ref 5–15)
BUN: 9 mg/dL (ref 6–20)
CO2: 21 mmol/L — AB (ref 22–32)
Calcium: 8.2 mg/dL — ABNORMAL LOW (ref 8.9–10.3)
Chloride: 105 mmol/L (ref 101–111)
Creatinine, Ser: 0.68 mg/dL (ref 0.61–1.24)
GLUCOSE: 74 mg/dL (ref 65–99)
POTASSIUM: 3.6 mmol/L (ref 3.5–5.1)
Sodium: 135 mmol/L (ref 135–145)

## 2016-05-23 LAB — HEPARIN LEVEL (UNFRACTIONATED): Heparin Unfractionated: 0.35 IU/mL (ref 0.30–0.70)

## 2016-05-23 MED ORDER — WHITE PETROLATUM GEL
Status: DC | PRN
  Administered 2016-05-23: 1 via TOPICAL
  Filled 2016-05-23: qty 1

## 2016-05-23 MED ORDER — AMOXICILLIN-POT CLAVULANATE 500-125 MG PO TABS
1.0000 | ORAL_TABLET | Freq: Three times a day (TID) | ORAL | Status: DC
  Administered 2016-05-23 – 2016-05-26 (×9): 500 mg via ORAL
  Filled 2016-05-23 (×12): qty 1

## 2016-05-23 MED ORDER — AMIODARONE HCL IN DEXTROSE 360-4.14 MG/200ML-% IV SOLN
30.0000 mg/h | INTRAVENOUS | Status: DC
  Administered 2016-05-23 – 2016-05-26 (×6): 30 mg/h via INTRAVENOUS
  Filled 2016-05-23 (×6): qty 200

## 2016-05-23 MED ORDER — SODIUM CHLORIDE 0.9 % IV SOLN: 250.0000 mL | INTRAVENOUS | Status: DC

## 2016-05-23 MED ORDER — AMIODARONE HCL IN DEXTROSE 360-4.14 MG/200ML-% IV SOLN
60.0000 mg/h | INTRAVENOUS | Status: AC
  Administered 2016-05-23: 60 mg/h via INTRAVENOUS
  Filled 2016-05-23: qty 200

## 2016-05-23 MED ORDER — SODIUM CHLORIDE 0.9% FLUSH: 3.0000 mL | INTRAVENOUS | Status: DC | PRN

## 2016-05-23 MED ORDER — SENNOSIDES-DOCUSATE SODIUM 8.6-50 MG PO TABS
1.0000 | ORAL_TABLET | Freq: Every day | ORAL | Status: DC
  Administered 2016-05-23: 1 via ORAL
  Filled 2016-05-23 (×2): qty 1

## 2016-05-23 MED ORDER — SODIUM CHLORIDE 0.9% FLUSH
3.0000 mL | Freq: Two times a day (BID) | INTRAVENOUS | Status: DC
  Administered 2016-05-25 – 2016-05-26 (×2): 3 mL via INTRAVENOUS

## 2016-05-23 MED ORDER — POLYETHYLENE GLYCOL 3350 17 G PO PACK
17.0000 g | PACK | Freq: Every day | ORAL | Status: DC
  Administered 2016-05-23: 17 g via ORAL
  Filled 2016-05-23 (×3): qty 1

## 2016-05-23 NOTE — Progress Notes (Signed)
[facility] Progress Note  Patient: Carl Bush [id number]   PCP: No primary care provider on file. DOB: 1971/04/16   DOA: 05/20/2016   DOS: 05/23/2016   Date of Service: the patient was seen and examined on 05/23/2016  Brief hospital course: Pt. with PMH of Alcohol abuse; admitted on 05/20/2016, with complaint of shortness of breath and chest pain, was found to have pericardial effusion, pulmonary embolism with A. fib/A flutter in RVR. Cardiology consulted. Started on IV heparin.  Currently further plan is Continue close monitoring in the step down.  Assessment and Plan: 1. Pulmonary embolism ([place]) Pulmonary embolism with pulmonary infarct lower segmental PE. Currently on IV heparin. Continue heparin until pericardial effusion appears to be stabilizing. Echocardiogram without any right heart strain. Patient will need hypercoagulable workup in 3 months  2. Viral pericarditis. Pericardial effusion. Patient's echocardiogram showed pericardial effusion which was thought to be secondary to pericarditis. Continues to have chest pain. Patient was started on colchicine since 0.6 twice a day. Repeat echocardiogram on 05/22/2016 shows smaller size of the effusion.  Highly appreciate cardiology input.  3. A. fib-A flutter with RVR. Cardiomyopathy of 35-40%. Patient's heart rate continues to remain in 130s. Started on Lopressor 37.5 mg twice a day without any adequate heart rate control. Patient started on IV amiodarone this afternoon with good heart rate control. Cardiology is planning TEE cardioversion on Monday.  4. Cardiomyopathy, likely alcohol induced. Patient's EF significantly lower. 45-50%, diffuse hypokinesis. Likely alcohol induced due to patient's heavy alcohol abuse. 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 Eventually patient will need ischemic workup.  5. Alcohol dependence. Patient drinks 12 packs every other  day. His cardiac myopathies likely secondary from the same. Currently on CIWA protocol. Continue to monitor in the step down unit.  6. smoker. Encourage cessation Nicotine patch ordered.  7. Suspected sepsis. Patient had fever tachycardia tachypnea as well as leukocytosis at admission. Patient was thought to be having sepsis likely secondary to pneumonia. Blood cultures are negative. Urine culture negative. MRSA PCR negative. HIV negative. Start on [place] and Zosyn on admission. My suspicion for an ongoing active infection is significantly less. I would change him to Augmentin to finish a 7 day treatment course.  8. Constipation. Stool softener ordered.  Pain management: Morphine when necessary Activity: Will need physical therapy Bowel regimen: last BM 05/20/2016 Diet: Cardiac diet DVT Prophylaxis: on therapeutic anticoagulation.  Advance goals of care discussion: Full code  Family Communication: no family was present at bedside, at the time of interview.   Disposition:  Discharge to home. Expected discharge date: 05/27/2016  Consultants: Cardiology Procedures: Echocardiogram  Antibiotics: Anti-infectives    Start     Dose/Rate Route Frequency Ordered Stop   05/23/16 1400  amoxicillin-clavulanate (AUGMENTIN) 500-125 MG per tablet 500 mg     1 tablet Oral Every 8 hours 05/23/16 1014     05/21/16 0000  piperacillin-tazobactam (ZOSYN) IVPB 3.375 g  Status:  Discontinued     3.375 g 12.5 mL/hr over 240 Minutes Intravenous Every 8 hours 05/20/16 2047 05/23/16 1013   05/21/16 0000  vancomycin (VANCOCIN) IVPB 1000 mg/200 mL premix  Status:  Discontinued     1,000 mg 200 mL/hr over 60 Minutes Intravenous Every 8 hours 05/20/16 2047 05/22/16 1358   05/20/16 2045  piperacillin-tazobactam (ZOSYN) IVPB 3.375 g  Status:  Discontinued     3.375 g 100 mL/hr over 30 Minutes Intravenous  Once 05/20/16 2041 05/20/16 2044   05/20/16  2045  vancomycin (VANCOCIN) IVPB 1000 mg/200 mL  premix  Status:  Discontinued     1,000 mg 200 mL/hr over 60 Minutes Intravenous  Once 05/20/16 2041 05/20/16 2044   05/20/16 1530  piperacillin-tazobactam (ZOSYN) IVPB 3.375 g     3.375 g 100 mL/hr over 30 Minutes Intravenous  Once 05/20/16 1524 05/20/16 1617   05/20/16 1530  vancomycin (VANCOCIN) IVPB 1000 mg/200 mL premix     1,000 mg 200 mL/hr over 60 Minutes Intravenous  Once 05/20/16 1524 05/20/16 1650        Subjective: Continues to have on and off chest pain. Denies any complains of dizziness or lightheadedness. No shortness of breath. No nausea and vomiting. No active bleeding.  Objective: Physical Exam: Vitals:   05/23/16 1000 05/23/16 1100 05/23/16 1200 05/23/16 1300  BP: (!) 117/93 105/86 103/86 126/86  Pulse: (!) 130 (!) 118 (!) 109 (!) 116  Resp: (!) 22 19 16 18   Temp:      TempSrc:      SpO2: 96% 98% 100% 97%  Weight:      Height:        Intake/Output Summary (Last 24 hours) at 05/23/16 1643 Last data filed at 05/23/16 1200  Gross per 24 hour  Intake           885.65 ml  Output             1300 ml  Net          -414.35 ml   Filed Weights   05/20/16 1700  Weight: 75.8 kg (167 lb)    General: Alert, Awake and Oriented to Time, Place and Person. Appear in mild distress, affect appropriate Eyes: PERRL, Conjunctiva normal ENT: Oral Mucosa clear moist. Neck: no JVD, no Abnormal Mass Or lumps Cardiovascular: S1 and S2 Present, no Murmur, Respiratory: Bilateral Air entry equal and Decreased, no use of accessory muscle, Clear to Auscultation, no Crackles, no wheezes Abdomen: Bowel Sound present, Soft and no tenderness Skin: no redness, no Rash, no induration Extremities: no Pedal edema, no calf tenderness Neurologic: Grossly no focal neuro deficit. Bilaterally Equal motor strength  Data Reviewed: CBC:  Recent Labs Lab 05/20/16 1450 05/21/16 0713 05/22/16 0802 05/23/16 0603  WBC 20.4* 18.9* 14.8* 13.0*  NEUTROABS 16.2*  --   --   --   HGB 13.1 11.1*  11.4* 11.1*  HCT 39.5 34.2* 34.6* 33.6*  MCV 90.8 89.8 89.9 88.7  PLT 390 319 283 [id number]   Basic Metabolic Panel:  Recent Labs Lab 05/20/16 1450 05/21/16 0713 05/21/16 2333 05/23/16 0055  NA 135 136 139 135  K 3.9 3.7 3.7 3.6  CL 102 105 105 105  CO2 23 23 26  21*  GLUCOSE 138* 102* 96 74  BUN 14 9 7 9   CREATININE 0.97 0.68 0.83 0.68  CALCIUM 8.8* 8.0* 8.4* 8.2*    Liver Function Tests:  Recent Labs Lab 05/20/16 1450  AST 37  ALT 25  ALKPHOS 108  BILITOT 0.7  PROT 6.8  ALBUMIN 3.2*   No results for input(s): LIPASE, AMYLASE in the last 168 hours. No results for input(s): AMMONIA in the last 168 hours. Coagulation Profile:  Recent Labs Lab 05/20/16 2100  INR 1.40   Cardiac Enzymes: No results for input(s): CKTOTAL, CKMB, CKMBINDEX, TROPONINI in the last 168 hours. BNP (last 3 results) No results for input(s): PROBNP in the last 8760 hours.  CBG:  Recent Labs Lab 05/20/16 1423  GLUCAP 154*    Studies:  No results found.   Scheduled Meds: . amoxicillin-clavulanate  1 tablet Oral Q8H  . colchicine  0.6 mg Oral BID  . folic acid  1 mg Oral Daily  . mouth rinse  15 mL Mouth Rinse BID  . metoprolol tartrate  37.5 mg Oral BID  . multivitamin with minerals  1 tablet Oral Daily  . nicotine  21 mg Transdermal Daily  . polyethylene glycol  17 g Oral Daily  . senna-docusate  1 tablet Oral QHS  . sodium chloride flush  3 mL Intravenous Q12H  . sodium chloride flush  3 mL Intravenous Q12H  . thiamine  100 mg Oral Daily   Or  . thiamine  100 mg Intravenous Daily   Continuous Infusions: . sodium chloride    . amiodarone 30 mg/hr (05/23/16 1618)  . heparin 2,500 Units/hr (05/23/16 1618)   PRN Meds: acetaminophen, LORazepam **OR** LORazepam, morphine injection, ondansetron **OR** ondansetron (ZOFRAN) IV, sodium chloride flush, white petrolatum  Time spent: 30 minutes  Author: Berle Mull, MD [facility] Pager: [phone] 05/23/2016 4:43  PM  If 7PM-7AM, please contact night-coverage at [email or web address], password [facility]

## 2016-05-23 NOTE — Progress Notes (Signed)
ANTICOAGULATION CONSULT NOTE - Follow Up Consult  Pharmacy Consult for heparin Indication: pulmonary embolus   Assessment: Carl Bush is a 45 yo male who presented on 11/14 with chest pain and shortness of breath. He is currently being treated with heparin for a pulmonary embolus and a-flutter. Of note, his Echo on 11/15 was concerning for a clot in the pericardium and conversion to hemopericardium. Repeat echo from 11/16 shows smaller area of effusion. It is suspected that he also has acute pericarditis from a viral syndrome.   The patient's heparin level this morning is at low end of goal at 0.35. CBC stable.   Goal of Therapy:  Heparin level 0.3-0.7 units/ml Monitor platelets by anticoagulation protocol: Yes   Plan:  1. Increase Heparin to 2500 units/hr (25 ml/hr) 2. Will continue to monitor for any signs/symptoms of bleeding   No Known Allergies  Patient Measurements: Height: 6\' 2"  (188 cm) (per pt report) Weight: 167 lb (75.8 kg) (per pt report) IBW/kg (Calculated) : 82.2 Heparin Dosing Weight: 75.8 kg  Vital Signs: Temp: 97.8 F (36.6 C) (11/17 0848) Temp Source: Oral (11/17 0848) BP: 103/72 (11/17 0848) Pulse Rate: 131 (11/17 0848)  Labs:  Recent Labs  05/20/16 2100  05/21/16 0713  05/21/16 2333 05/22/16 0802 05/22/16 1310 05/22/16 2125 05/23/16 0055 05/23/16 0603  HGB  --   --  11.1*  --   --  11.4*  --   --   --  11.1*  HCT  --   --  34.2*  --   --  34.6*  --   --   --  33.6*  PLT  --   --  319  --   --  283  --   --   --  358  APTT 51*  --   --   --   --   --   --   --   --   --   LABPROT 17.2*  --   --   --   --   --   --   --   --   --   INR 1.40  --   --   --   --   --   --   --   --   --   HEPARINUNFRC  --   < > <0.10*  < > 0.23* 0.40 0.20* 0.24*  --  0.35  CREATININE  --   --  0.68  --  0.83  --   --   --  0.68  --   < > = values in this interval not displayed.  Estimated Creatinine Clearance: 127.6 mL/min (by C-G formula based on SCr of 0.68  mg/dL).    Thank you for allowing pharmacy to be a part of this patient's care.  Erin Hearing PharmD., BCPS Clinical Pharmacist Pager [phone] 05/23/2016 9:16 AM

## 2016-05-23 NOTE — Progress Notes (Addendum)
Patient Name: Carl Bush Date of Encounter: 05/23/2016  Primary Cardiologist: Dr. Sundra Aland Problem List     Principal Problem:   Pulmonary embolism [facility]) Active Problems:   Sepsis ([place])   Tobacco use disorder   Alcohol dependence ([place])   Atrial flutter ([facility])   Tachycardia   Effusion, pericardium   Pericardial effusion     Subjective   Feels better today.  Inpatient Medications    Scheduled Meds: . colchicine  0.6 mg Oral BID  . folic acid  1 mg Oral Daily  . mouth rinse  15 mL Mouth Rinse BID  . metoprolol tartrate  37.5 mg Oral BID  . multivitamin with minerals  1 tablet Oral Daily  . nicotine  21 mg Transdermal Daily  . piperacillin-tazobactam (ZOSYN)  IV  3.375 g Intravenous Q8H  . sodium chloride flush  3 mL Intravenous Q12H  . thiamine  100 mg Oral Daily   Or  . thiamine  100 mg Intravenous Daily   Continuous Infusions: . heparin 2,450 Units/hr (05/23/16 0529)   PRN Meds: acetaminophen, LORazepam **OR** LORazepam, morphine injection, ondansetron **OR** ondansetron (ZOFRAN) IV, white petrolatum   Vital Signs    Vitals:   05/23/16 0300 05/23/16 0400 05/23/16 0600 05/23/16 0848  BP: 103/65 103/65 108/89 103/72  Pulse: (!) 123 (!) 115 (!) 129 (!) 131  Resp: (!) 28 (!) 29  18  Temp: 99 F (37.2 C)   97.8 F (36.6 C)  TempSrc: Oral   Oral  SpO2: 94% 93%  100%  Weight:      Height:        Intake/Output Summary (Last 24 hours) at 05/23/16 0922 Last data filed at 05/23/16 0200  Gross per 24 hour  Intake           543.25 ml  Output             1275 ml  Net          -731.75 ml   Filed Weights   05/20/16 1700  Weight: 167 lb (75.8 kg)    Physical Exam    GEN: Well nourished, well developed, in no acute distress.  HEENT: Grossly normal.  Neck: Supple, no JVD, carotid bruits, or masses. Cardiac: RRR tachy, no murmurs, rubs, or gallops. No clubbing, cyanosis, edema.  Radials/DP/PT 2+ and equal bilaterally.  Respiratory:   Respirations regular and unlabored, clear to auscultation bilaterally. GI: Soft, nontender, nondistended, BS + x 4. MS: no deformity or atrophy. Skin: warm and dry, no rash. Neuro:  Strength and sensation are intact. Psych: AAOx3.  Normal affect.  Labs    CBC  Recent Labs  05/20/16 1450  05/22/16 0802 05/23/16 0603  WBC 20.4*  < > 14.8* 13.0*  NEUTROABS 16.2*  --   --   --   HGB 13.1  < > 11.4* 11.1*  HCT 39.5  < > 34.6* 33.6*  MCV 90.8  < > 89.9 88.7  PLT 390  < > 283 358  < > = values in this interval not displayed. Basic Metabolic Panel  Recent Labs  05/21/16 2333 05/23/16 0055  NA 139 135  K 3.7 3.6  CL 105 105  CO2 26 21*  GLUCOSE 96 74  BUN 7 9  CREATININE 0.83 0.68  CALCIUM 8.4* 8.2*   Liver Function Tests  Recent Labs  05/20/16 1450  AST 37  ALT 25  ALKPHOS 108  BILITOT 0.7  PROT 6.8  ALBUMIN 3.2*  No results for input(s): LIPASE, AMYLASE in the last 72 hours. Cardiac Enzymes No results for input(s): CKTOTAL, CKMB, CKMBINDEX, TROPONINI in the last 72 hours. BNP Invalid input(s): POCBNP D-Dimer No results for input(s): DDIMER in the last 72 hours. Hemoglobin A1C No results for input(s): HGBA1C in the last 72 hours. Fasting Lipid Panel No results for input(s): CHOL, HDL, LDLCALC, TRIG, CHOLHDL, LDLDIRECT in the last 72 hours. Thyroid Function Tests  Recent Labs  05/20/16 1450  TSH 2.007    Telemetry     - Personally Reviewed  ECG     - Personally Reviewed  Radiology    No results found.  Cardiac Studies   Study Conclusions  - Left ventricle: The cavity size was normal. There was   mild-moderate concentric hypertrophy. Systolic function was   mildly reduced. The estimated ejection fraction was in the range   of 45% to 50%. Diffuse hypokinesis. - Aortic valve: Transvalvular velocity was within the normal range.   There was no stenosis. There was no regurgitation. - Mitral valve: Transvalvular velocity was within the  normal range.   There was no evidence for stenosis. There was no regurgitation. - Right atrium: The atrium was moderately dilated. - Atrial septum: No defect or patent foramen ovale was identified   by color flow Doppler. - Tricuspid valve: There was mild regurgitation. - Pulmonary arteries: Systolic pressure was within the normal   range. PA peak pressure: 14 mm Hg (S). - Pericardium, extracardiac: A moderate pericardial effusion was   identified. The fluid contained focal strands near the RV apex   and possible thrombus concerning for hemopericadium. There was   mildright ventricular chamber collapse. Respirophasic change in   stroke volume was normal. Features were consistent with early   tamponade physiology.  Study Conclusions (Repeat echo)  - Left ventricle: The cavity size was normal. Wall thickness was   increased in a pattern of mild LVH. Systolic function was   moderately reduced. The estimated ejection fraction was in the   range of 35% to 40%. - Mitral valve: There was mild regurgitation. - Right ventricle: The cavity size was mildly dilated. Wall   thickness was normal. Systolic function was mildly to moderately   reduced. - Pericardium, extracardiac: Small to moderate pericardial   effusion, mostly seen around RV with some strandng Does not meet   echo criteria for tamponade Some stranding seen in pericardial   space. Compared to echo from yesterday it appears a little   smaller.   Patient Profile     Patient with no prior cardiac history with recent racoon bite s/p rabies vaccine treatments in August and since then has not felt well.  Has had SOB and chest discomfort off and on and he though it was related to the racoon bite but symptoms persisted and have gotten worse.  He noticed palpitations as well.  Came to ER and was in atrial flutter with RVR and started on IV Diltiazem.  WBC noted at 20.4 and normal TSH.  Chest CT showed left lowe segment PE with probable  pulmonary infarct and moderate pericardial effusion.  Echo showed mild LV dysfunction with EF 45-50% ? Secondary to tachycardia induced CM from atrial flutter of unknown duration.  Repeat echo with EF 30-35% with smaller effusion.  Assessment & Plan    1. Pericardial effusion-mild to mod.  I suspect he has acute pericarditis likely viral etiology.  Reviewed echo and CT images.  I do not think there is clot  in the pericardium.  This is likely infarcted lung behind the heart.  He is at higher risk of converting to hemopericardium as he has to be on Heparin for PE so will follow with serial echo.  Repeat limited echo yesterday with showed smaller effusion. Continue Colchicine 0.6mg  BID.  Avoid NSAIDS as he is on heparin.  2.Typical  A flutter RVR with abnormal EKG difficult to eval ST due to flutter waves -on lopressor 37.5mg  BID but HR not well controlled.  In 130's this am. Given LV dysfunction, need to avoid use of cardizem.  Soft BP limits uptitration of BB.  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  At this time I think we need to consider TEE/DCCV.  I will see if anesthesia is willing to do later this afternoon.  He ate at 9am. In setting of acute pericarditis and PE there is a higher risk of converting bact to aflutter post DCCV.  I will add IV Amio to try to slow HR down and also to prevent reoccurence of afib post DCCV.  3. Pulmonary embolism no rt heart strain.  On IV heparin   4.  Sepsis on abx- followed by FP.  WBC trending downward and likely related to PE and acute pericarditis.  Blood cx neg thus far.  Signed, Fransico Him, MD  05/23/2016, 9:22 AM

## 2016-05-23 NOTE — Progress Notes (Signed)
Heart rate improved on IV Amio gtt.  Will cancel TEE/DCCV for now and load Amio over the weekend and put on schedule for TEE/DCCV on Monday.

## 2016-05-24 ENCOUNTER — Inpatient Hospital Stay (HOSPITAL_COMMUNITY): Payer: BLUE CROSS/BLUE SHIELD

## 2016-05-24 DIAGNOSIS — Z86711 Personal history of pulmonary embolism: Secondary | ICD-10-CM

## 2016-05-24 LAB — CBC
HEMATOCRIT: 34.8 % — AB (ref 39.0–52.0)
Hemoglobin: 11.6 g/dL — ABNORMAL LOW (ref 13.0–17.0)
MCH: 29.6 pg (ref 26.0–34.0)
MCHC: 33.3 g/dL (ref 30.0–36.0)
MCV: 88.8 fL (ref 78.0–100.0)
PLATELETS: 392 10*3/uL (ref 150–400)
RBC: 3.92 MIL/uL — ABNORMAL LOW (ref 4.22–5.81)
RDW: 13.7 % (ref 11.5–15.5)
WBC: 13.2 10*3/uL — ABNORMAL HIGH (ref 4.0–10.5)

## 2016-05-24 LAB — HEPARIN LEVEL (UNFRACTIONATED): HEPARIN UNFRACTIONATED: 0.33 [IU]/mL (ref 0.30–0.70)

## 2016-05-24 MED ORDER — ZOLPIDEM TARTRATE 5 MG PO TABS: 5.0000 mg | ORAL_TABLET | Freq: Every evening | ORAL | Status: DC | PRN

## 2016-05-24 NOTE — Progress Notes (Signed)
Patient Name: Carl Bush Date of Encounter: 05/24/2016  Primary Cardiologist: Dr. Sundra Aland Problem List     Principal Problem:   Pulmonary embolism [facility]) Active Problems:   Sepsis ([place])   Tobacco use disorder   Alcohol dependence ([place])   Atrial flutter ([facility])   Tachycardia   Effusion, pericardium   Pericardial effusion   DCM (dilated cardiomyopathy) ([place])     Subjective   Feels better today. Exceedingly frustrated related to care issues medication  Inpatient Medications    Scheduled Meds: . amoxicillin-clavulanate  1 tablet Oral Q8H  . colchicine  0.6 mg Oral BID  . folic acid  1 mg Oral Daily  . mouth rinse  15 mL Mouth Rinse BID  . metoprolol tartrate  37.5 mg Oral BID  . multivitamin with minerals  1 tablet Oral Daily  . nicotine  21 mg Transdermal Daily  . polyethylene glycol  17 g Oral Daily  . senna-docusate  1 tablet Oral QHS  . sodium chloride flush  3 mL Intravenous Q12H  . sodium chloride flush  3 mL Intravenous Q12H  . thiamine  100 mg Oral Daily   Or  . thiamine  100 mg Intravenous Daily   Continuous Infusions: . sodium chloride    . amiodarone 30 mg/hr (05/24/16 0700)  . heparin 2,500 Units/hr (05/24/16 0700)   PRN Meds: acetaminophen, morphine injection, ondansetron **OR** ondansetron (ZOFRAN) IV, sodium chloride flush, white petrolatum   Vital Signs    Vitals:   05/24/16 0900 05/24/16 1000 05/24/16 1100 05/24/16 1203  BP: 99/65 103/76 116/86 109/89  Pulse: (!) 102 99 99 (!) 108  Resp: 20 14 18  (!) 26  Temp:    98 F (36.7 C)  TempSrc:    Oral  SpO2: 99% 100% 98% 98%  Weight:      Height:        Intake/Output Summary (Last 24 hours) at 05/24/16 1226 Last data filed at 05/24/16 1100  Gross per 24 hour  Intake          1361.51 ml  Output             1975 ml  Net          -613.49 ml   Filed Weights   05/20/16 1700  Weight: 167 lb (75.8 kg)    Physical Exam    GEN: Well nourished, well developed, in no acute  distress.  HEENT: Grossly normal.  Neck: Supple, no JVD, carotid bruits, or masses. Cardiac: RRR tachy, no murmurs, rubs, or gallops. No clubbing, cyanosis, edema.  Radials/DP/PT 2+ and equal bilaterally.  Respiratory:  Respirations regular and unlabored, clear to auscultation bilaterally. GI: Soft, nontender, nondistended, BS + x 4. MS: no deformity or atrophy. Skin: warm and dry, no rash. Neuro:  Strength and sensation are intact. Psych: AAOx3.  Normal affect.  Labs    CBC  Recent Labs  05/23/16 0603 05/24/16 0234  WBC 13.0* 13.2*  HGB 11.1* 11.6*  HCT 33.6* 34.8*  MCV 88.7 88.8  PLT 358 0000000   Basic Metabolic Panel  Recent Labs  05/21/16 2333 05/23/16 0055  NA 139 135  K 3.7 3.6  CL 105 105  CO2 26 21*  GLUCOSE 96 74  BUN 7 9  CREATININE 0.83 0.68  CALCIUM 8.4* 8.2*   Liver Function Tests No results for input(s): AST, ALT, ALKPHOS, BILITOT, PROT, ALBUMIN in the last 72 hours. No results for input(s): LIPASE, AMYLASE in the last 72 hours.  Cardiac Enzymes No results for input(s): CKTOTAL, CKMB, CKMBINDEX, TROPONINI in the last 72 hours. BNP Invalid input(s): POCBNP D-Dimer No results for input(s): DDIMER in the last 72 hours. Hemoglobin A1C No results for input(s): HGBA1C in the last 72 hours. Fasting Lipid Panel No results for input(s): CHOL, HDL, LDLCALC, TRIG, CHOLHDL, LDLDIRECT in the last 72 hours. Thyroid Function Tests No results for input(s): TSH, T4TOTAL, T3FREE, THYROIDAB in the last 72 hours.  Invalid input(s): [place] with controlled VR  ECG     - Personally Reviewed  Radiology    No results found.  Cardiac Studies   Study Conclusions  - Left ventricle: The cavity size was normal. There was   mild-moderate concentric hypertrophy. Systolic function was   mildly reduced. The estimated ejection fraction was in the range   of 45% to 50%. Diffuse hypokinesis. - Aortic valve:  Transvalvular velocity was within the normal range.   There was no stenosis. There was no regurgitation. - Mitral valve: Transvalvular velocity was within the normal range.   There was no evidence for stenosis. There was no regurgitation. - Right atrium: The atrium was moderately dilated. - Atrial septum: No defect or patent foramen ovale was identified   by color flow Doppler. - Tricuspid valve: There was mild regurgitation. - Pulmonary arteries: Systolic pressure was within the normal   range. PA peak pressure: 14 mm Hg (S). - Pericardium, extracardiac: A moderate pericardial effusion was   identified. The fluid contained focal strands near the RV apex   and possible thrombus concerning for hemopericadium. There was   mildright ventricular chamber collapse. Respirophasic change in   stroke volume was normal. Features were consistent with early   tamponade physiology.  Study Conclusions (Repeat echo)  - Left ventricle: The cavity size was normal. Wall thickness was   increased in a pattern of mild LVH. Systolic function was   moderately reduced. The estimated ejection fraction was in the   range of 35% to 40%. - Mitral valve: There was mild regurgitation. - Right ventricle: The cavity size was mildly dilated. Wall   thickness was normal. Systolic function was mildly to moderately   reduced. - Pericardium, extracardiac: Small to moderate pericardial   effusion, mostly seen around RV with some strandng Does not meet   echo criteria for tamponade Some stranding seen in pericardial   space. Compared to echo from yesterday it appears a little   smaller.   Patient Profile     Patient with no prior cardiac history with recent racoon bite s/p rabies vaccine treatments in August and since then has not felt well.  Has had SOB and chest discomfort off and on and he though it was related to the racoon bite but symptoms persisted and have gotten worse.  He noticed palpitations as well.   Came to ER and was in atrial flutter with RVR and started on IV Diltiazem.  WBC noted at 20.4 and normal TSH.  Chest CT showed left lowe segment PE with probable pulmonary infarct and moderate pericardial effusion.  Echo showed mild LV dysfunction with EF 45-50% ? Secondary to tachycardia induced CM from atrial flutter of unknown duration.  Repeat echo with EF 30-35% with smaller effusion.  Assessment & Plan    Pericardial effusion  Atrial flutter with rapid rate  Pulmonary embolism  Leukocytosis  Cardiomyopathy question mechanism   The patient has a plethora of issues and I am  not sure what the unifying diagnosis is. From a therapeutic point of view, anticoagulation is indicated atrial flutter and pulmonary embolism. He needs TEE guidance and will need oral anticoagulation prior to discharge. The pulmonary embolism apixoban dose 10 mg twice daily. We will initiate in the morning. Based on the recent trial fromESCardiology should be more than sufficient prior to his cardioversion. There has been concern about his pericardial effusion; I've discussed this with Dr. Eugene Garnet. We will repeat the echo today prior to initiation of ELIQUIS.  We will continue him on amiodarone as there is a relatively high risk of recurrent atrial arrhythmias in the context of his pericarditis. Hopefully his atrial arrhythmias are secondary to human pericarditis or pulmonary embolism and long-term anticoagulation for this indication will not be necessary. I will defer the issues of long-term anticoagulation for his pulmonary embolism to his primary care team.  He will also need long-term therapy for his cardiomyopathy. I anticipate need for mildly. Maybe this is related to alcohol.  Reviewed in length with the patient this morning.     Signed, Virl Axe, MD  05/24/2016, 12:26 PM

## 2016-05-24 NOTE — Progress Notes (Signed)
ANTICOAGULATION CONSULT NOTE - Follow Up Consult  Pharmacy Consult for heparin Indication: pulmonary embolus / New Afib  No Known Allergies  Patient Measurements: Height: 6\' 2"  (188 cm) (per pt report) Weight: 167 lb (75.8 kg) (per pt report) IBW/kg (Calculated) : 82.2 Heparin Dosing Weight: 75.8 kg  Vital Signs: Temp: 98 F (36.7 C) (11/18 1203) Temp Source: Oral (11/18 1203) BP: 109/89 (11/18 1203) Pulse Rate: 108 (11/18 1203)  Labs:  Recent Labs  05/21/16 2333  05/22/16 0802  05/22/16 2125 05/23/16 0055 05/23/16 0603 05/24/16 0234  HGB  --   < > 11.4*  --   --   --  11.1* 11.6*  HCT  --   --  34.6*  --   --   --  33.6* 34.8*  PLT  --   --  283  --   --   --  358 392  HEPARINUNFRC 0.23*  --  0.40  < > 0.24*  --  0.35 0.33  CREATININE 0.83  --   --   --   --  0.68  --   --   < > = values in this interval not displayed.  Estimated Creatinine Clearance: 127.6 mL/min (by C-G formula based on SCr of 0.68 mg/dL).   Assessment: MC is a 45 yo male who presented on 11/14 with chest pain and shortness of breath. He is currently being treated with heparin for a pulmonary embolus and new Afib. Of note, his Echo on 11/15 was concerning for a clot in the pericardium and conversion to hemopericardium. Repeat echo from 11/16 shows smaller area of effusion. It is suspected that he also has acute pericarditis from a viral syndrome. - started on colchicine.  The patient's heparin level therapeutic 0.33 heparin drip rate 2500 uts/hr. CBC stable  Goal of Therapy:  Heparin level 0.3-0.7 units/ml Monitor platelets by anticoagulation protocol: Yes   Plan:  Continue  Heparin to 2500 units/hr  Will continue to monitor for any signs/symptoms of bleeding   Bonnita Nasuti Pharm.D. CPP, BCPS Clinical Pharmacist [phone] 05/24/2016 12:51 PM

## 2016-05-24 NOTE — Progress Notes (Addendum)
VASCULAR LAB PRELIMINARY  PRELIMINARY  PRELIMINARY  PRELIMINARY  Bilateral lower extremity venous duplex completed.    Preliminary report:  There is no obvious evidence of  DVT or SVT noted in the bilateral lower extremities.  There is sluggish flow noted bilaterally.  Waveforms are pulsatile suggestive of fluid overload.  Fabrice Dyal, RVT 05/24/2016, 11:32 AM

## 2016-05-24 NOTE — Progress Notes (Signed)
[facility] Progress Note  Patient: Carl Bush [id number]   PCP: No primary care provider on file. DOB: 04-Dec-1970   DOA: 05/20/2016   DOS: 05/24/2016   Date of Service: the patient was seen and examined on 05/24/2016  Brief hospital course: Pt. with PMH of Alcohol abuse; admitted on 05/20/2016, with complaint of shortness of breath and chest pain, was found to have pericardial effusion, pulmonary embolism with A. fib/A flutter in RVR. Cardiology consulted. Started on IV heparin.  Currently further plan is Continue close monitoring in the step down.  Assessment and Plan: 1. Acute unprovoked pulmonary embolism Pulmonary embolism with pulmonary infarct lower segmental PE. Currently on IV heparin. Continue heparin until pericardial effusion appears to be stabilizing. Echocardiogram without any right heart strain. Patient will need hypercoagulable workup in 3 months. Check Dopplers lower extremity.  2. Viral pericarditis. Pericardial effusion. Patient's echocardiogram showed pericardial effusion which was thought to be secondary to pericarditis. Continues to have chest pain. Patient was started on colchicine since 0.6 twice a day. Repeat echocardiogram on 05/22/2016 shows smaller size of the effusion.  Highly appreciate cardiology input. Patient is at high risk for worsening of his pericardial effusion while on anticoagulation.  3. A. fib-A flutter with RVR. Cardiomyopathy of 35-40%. Patient's heart rate remains in 110S. Started on Lopressor 37.5 mg twice a day without any adequate heart rate control. On 05/23/2016 Patient started on IV amiodarone for heart rate control. Cardiology is planning TEE cardioversion on Monday.  4. Cardiomyopathy, likely alcohol induced. Patient's EF significantly lower. 45-50%, diffuse hypokinesis. Likely alcohol induced due to patient's heavy alcohol abuse. 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 Eventually patient will need ischemic workup.  5. Alcohol dependence. Patient drinks 12 packs every other day. His cardiomyopathies likely secondary from the same. Currently on CIWA protocol. Continue to monitor in the step down unit.  6. smoker. Encourage cessation Nicotine patch ordered.  7. Suspected sepsis. Patient had fever tachycardia tachypnea as well as leukocytosis at admission. Patient was thought to be having sepsis likely secondary to pneumonia. Blood cultures are negative. Urine culture negative. MRSA PCR negative. HIV negative. Start on [place] and Zosyn on admission. My suspicion for an ongoing active infection is significantly less. I would change him to Augmentin to finish a 7 day treatment course.  8. Constipation. Stool softener ordered.  Patient is threatening to leave AMA. And found that he is at high risk for adverse outcome given combined presentation with pulmonary embolism requiring anticoagulation, pericarditis and pericardial effusion, as well as rapid ventricular rate. Patient is requesting to go ahead with TEE if possible today. We will discuss with cardiology.  Pain management: Morphine when necessary Activity: Will need physical therapy Bowel regimen: last BM 05/23/2016 Diet: Cardiac diet DVT Prophylaxis: on therapeutic anticoagulation.  Advance goals of care discussion: Full code  Family Communication: no family was present at bedside, at the time of interview.   Disposition:  Discharge to home. Expected discharge date: 05/27/2016  Consultants: Cardiology Procedures: Echocardiogram  Antibiotics: Anti-infectives    Start     Dose/Rate Route Frequency Ordered Stop   05/23/16 1400  amoxicillin-clavulanate (AUGMENTIN) 500-125 MG per tablet 500 mg     1 tablet Oral Every 8 hours 05/23/16 1014     05/21/16 0000  piperacillin-tazobactam (ZOSYN) IVPB 3.375 g  Status:  Discontinued     3.375 g 12.5 mL/hr over 240 Minutes  Intravenous Every 8 hours 05/20/16 2047 05/23/16 1013  05/21/16 0000  vancomycin (VANCOCIN) IVPB 1000 mg/200 mL premix  Status:  Discontinued     1,000 mg 200 mL/hr over 60 Minutes Intravenous Every 8 hours 05/20/16 2047 05/22/16 1358   05/20/16 2045  piperacillin-tazobactam (ZOSYN) IVPB 3.375 g  Status:  Discontinued     3.375 g 100 mL/hr over 30 Minutes Intravenous  Once 05/20/16 2041 05/20/16 2044   05/20/16 2045  vancomycin (VANCOCIN) IVPB 1000 mg/200 mL premix  Status:  Discontinued     1,000 mg 200 mL/hr over 60 Minutes Intravenous  Once 05/20/16 2041 05/20/16 2044   05/20/16 1530  piperacillin-tazobactam (ZOSYN) IVPB 3.375 g     3.375 g 100 mL/hr over 30 Minutes Intravenous  Once 05/20/16 1524 05/20/16 1617   05/20/16 1530  vancomycin (VANCOCIN) IVPB 1000 mg/200 mL premix     1,000 mg 200 mL/hr over 60 Minutes Intravenous  Once 05/20/16 1524 05/20/16 1650      Subjective: No chest pain. Nausea and vomiting. No shortness of breath. Agitated and Wanting to go home.  Objective: Physical Exam: Vitals:   05/24/16 0500 05/24/16 0600 05/24/16 0821 05/24/16 0838  BP: 126/88 124/85 (!) 123/92   Pulse: 97 99 (!) 126 (!) 106  Resp: (!) 22 (!) 22 13 18   Temp:   97.8 F (36.6 C)   TempSrc:   Oral   SpO2: 97% 98% 100% 100%  Weight:      Height:        Intake/Output Summary (Last 24 hours) at 05/24/16 0934 Last data filed at 05/24/16 [id number]  Gross per 24 hour  Intake          1226.91 ml  Output             1975 ml  Net          -748.09 ml   Filed Weights   05/20/16 1700  Weight: 75.8 kg (167 lb)    General: Alert, Awake and Oriented to Time, Place and Person. Appear in mild distress, affect Agitated Eyes: PERRL, Conjunctiva normal ENT: Oral Mucosa clear moist. Neck: no JVD, no Abnormal Mass Or lumps Cardiovascular: S1 and S2 Present, no Murmur, Respiratory: Bilateral Air entry equal and Decreased, no use of accessory muscle, Clear to Auscultation, no Crackles, no  wheezes Abdomen: Bowel Sound present, Soft and no tenderness Skin: no redness, no Rash, no induration Extremities: no Pedal edema, no calf tenderness Neurologic: Grossly no focal neuro deficit. Bilaterally Equal motor strength  Data Reviewed: CBC:  Recent Labs Lab 05/20/16 1450 05/21/16 0713 05/22/16 0802 05/23/16 0603 05/24/16 0234  WBC 20.4* 18.9* 14.8* 13.0* 13.2*  NEUTROABS 16.2*  --   --   --   --   HGB 13.1 11.1* 11.4* 11.1* 11.6*  HCT 39.5 34.2* 34.6* 33.6* 34.8*  MCV 90.8 89.8 89.9 88.7 88.8  PLT 390 319 283 358 0000000   Basic Metabolic Panel:  Recent Labs Lab 05/20/16 1450 05/21/16 0713 05/21/16 2333 05/23/16 0055  NA 135 136 139 135  K 3.9 3.7 3.7 3.6  CL 102 105 105 105  CO2 23 23 26  21*  GLUCOSE 138* 102* 96 74  BUN 14 9 7 9   CREATININE 0.97 0.68 0.83 0.68  CALCIUM 8.8* 8.0* 8.4* 8.2*    Liver Function Tests:  Recent Labs Lab 05/20/16 1450  AST 37  ALT 25  ALKPHOS 108  BILITOT 0.7  PROT 6.8  ALBUMIN 3.2*   No results for input(s): LIPASE, AMYLASE in the last 168 hours. No  results for input(s): AMMONIA in the last 168 hours. Coagulation Profile:  Recent Labs Lab 05/20/16 2100  INR 1.40   Cardiac Enzymes: No results for input(s): CKTOTAL, CKMB, CKMBINDEX, TROPONINI in the last 168 hours. BNP (last 3 results) No results for input(s): PROBNP in the last 8760 hours.  CBG:  Recent Labs Lab 05/20/16 1423  GLUCAP 154*    Studies: No results found.   Scheduled Meds: . amoxicillin-clavulanate  1 tablet Oral Q8H  . colchicine  0.6 mg Oral BID  . folic acid  1 mg Oral Daily  . mouth rinse  15 mL Mouth Rinse BID  . metoprolol tartrate  37.5 mg Oral BID  . multivitamin with minerals  1 tablet Oral Daily  . nicotine  21 mg Transdermal Daily  . polyethylene glycol  17 g Oral Daily  . senna-docusate  1 tablet Oral QHS  . sodium chloride flush  3 mL Intravenous Q12H  . sodium chloride flush  3 mL Intravenous Q12H  . thiamine  100 mg  Oral Daily   Or  . thiamine  100 mg Intravenous Daily   Continuous Infusions: . sodium chloride    . amiodarone 30 mg/hr (05/24/16 0600)  . heparin 2,500 Units/hr (05/24/16 0600)   PRN Meds: acetaminophen, morphine injection, ondansetron **OR** ondansetron (ZOFRAN) IV, sodium chloride flush, white petrolatum  Time spent: 30 minutes  Author: Berle Mull, MD [facility] Pager: [phone] 05/24/2016 9:34 AM  If 7PM-7AM, please contact night-coverage at [email or web address], password [facility]

## 2016-05-25 ENCOUNTER — Inpatient Hospital Stay (HOSPITAL_COMMUNITY): Payer: BLUE CROSS/BLUE SHIELD

## 2016-05-25 DIAGNOSIS — I319 Disease of pericardium, unspecified: Secondary | ICD-10-CM

## 2016-05-25 LAB — BASIC METABOLIC PANEL
Anion gap: 9 (ref 5–15)
BUN: 8 mg/dL (ref 6–20)
CALCIUM: 8.5 mg/dL — AB (ref 8.9–10.3)
CHLORIDE: 105 mmol/L (ref 101–111)
CO2: 23 mmol/L (ref 22–32)
CREATININE: 0.77 mg/dL (ref 0.61–1.24)
Glucose, Bld: 109 mg/dL — ABNORMAL HIGH (ref 65–99)
Potassium: 3.1 mmol/L — ABNORMAL LOW (ref 3.5–5.1)
SODIUM: 137 mmol/L (ref 135–145)

## 2016-05-25 LAB — CULTURE, BLOOD (ROUTINE X 2)
CULTURE: NO GROWTH
Culture: NO GROWTH

## 2016-05-25 LAB — CBC
HCT: 33.8 % — ABNORMAL LOW (ref 39.0–52.0)
HEMOGLOBIN: 11.2 g/dL — AB (ref 13.0–17.0)
MCH: 29.4 pg (ref 26.0–34.0)
MCHC: 33.1 g/dL (ref 30.0–36.0)
MCV: 88.7 fL (ref 78.0–100.0)
PLATELETS: 397 10*3/uL (ref 150–400)
RBC: 3.81 MIL/uL — ABNORMAL LOW (ref 4.22–5.81)
RDW: 13.6 % (ref 11.5–15.5)
WBC: 12.1 10*3/uL — ABNORMAL HIGH (ref 4.0–10.5)

## 2016-05-25 LAB — ECHOCARDIOGRAM LIMITED
FS: 30 % (ref 28–44)
HEIGHTINCHES: 74 in
IVS/LV PW RATIO, ED: 0.83
LADIAMINDEX: 1.99 cm/m2
LASIZE: 40 mm
LEFT ATRIUM END SYS DIAM: 40 mm
LV PW d: 11.1 mm — AB (ref 0.6–1.1)
LVOT area: 5.73 cm2
LVOTD: 27 mm
WEIGHTICAEL: 2672 [oz_av]

## 2016-05-25 LAB — HEPARIN LEVEL (UNFRACTIONATED): HEPARIN UNFRACTIONATED: 0.47 [IU]/mL (ref 0.30–0.70)

## 2016-05-25 MED ORDER — APIXABAN 5 MG PO TABS
10.0000 mg | ORAL_TABLET | Freq: Two times a day (BID) | ORAL | Status: DC
  Administered 2016-05-25 – 2016-05-26 (×2): 10 mg via ORAL
  Filled 2016-05-25 (×2): qty 2

## 2016-05-25 MED ORDER — APIXABAN 5 MG PO TABS: 5.0000 mg | ORAL_TABLET | Freq: Two times a day (BID) | ORAL | Status: DC

## 2016-05-25 MED ORDER — POTASSIUM CHLORIDE CRYS ER 20 MEQ PO TBCR
40.0000 meq | EXTENDED_RELEASE_TABLET | Freq: Once | ORAL | Status: AC
  Administered 2016-05-25: 40 meq via ORAL
  Filled 2016-05-25: qty 2

## 2016-05-25 NOTE — Progress Notes (Signed)
Notified echo tech that echo needs to be done tonight.

## 2016-05-25 NOTE — Progress Notes (Signed)
Patient Name: Carl Bush Date of Encounter: 05/25/2016  Primary Cardiologist: Dr. Sundra Aland Problem List     Principal Problem:   Pulmonary embolism [facility]) Active Problems:   Sepsis ([place])   Tobacco use disorder   Alcohol dependence ([place])   Atrial flutter ([facility])   Tachycardia   Effusion, pericardium   Pericardial effusion   DCM (dilated cardiomyopathy) ([place])     Subjective   Feels better today Await TEE In am ( orders written )  Inpatient Medications    Scheduled Meds: . amoxicillin-clavulanate  1 tablet Oral Q8H  . colchicine  0.6 mg Oral BID  . folic acid  1 mg Oral Daily  . mouth rinse  15 mL Mouth Rinse BID  . metoprolol tartrate  37.5 mg Oral BID  . multivitamin with minerals  1 tablet Oral Daily  . nicotine  21 mg Transdermal Daily  . polyethylene glycol  17 g Oral Daily  . senna-docusate  1 tablet Oral QHS  . sodium chloride flush  3 mL Intravenous Q12H  . sodium chloride flush  3 mL Intravenous Q12H  . thiamine  100 mg Oral Daily   Or  . thiamine  100 mg Intravenous Daily   Continuous Infusions: . sodium chloride    . amiodarone 30 mg/hr (05/25/16 0339)  . heparin 2,500 Units/hr (05/24/16 2324)   PRN Meds: acetaminophen, morphine injection, ondansetron **OR** ondansetron (ZOFRAN) IV, sodium chloride flush, white petrolatum, zolpidem   Vital Signs    Vitals:   05/25/16 0000 05/25/16 0400 05/25/16 0600 05/25/16 0803  BP: 120/86 102/65 103/69 115/86  Pulse: (!) 101 89 (!) 108 96  Resp:  (!) 22  19  Temp:  98.2 F (36.8 C)  98.3 F (36.8 C)  TempSrc:  Oral  Oral  SpO2:  95%  100%  Weight:      Height:        Intake/Output Summary (Last 24 hours) at 05/25/16 1014 Last data filed at 05/25/16 1000  Gross per 24 hour  Intake           1000.8 ml  Output             1000 ml  Net              0.8 ml   Filed Weights   05/20/16 1700  Weight: 167 lb (75.8 kg)    Physical Exam    GEN: Well nourished, well developed, in no acute  distress.  HEENT: Grossly normal.  Neck: Supple, no JVD, carotid bruits, or masses. Cardiac: RRR tachy, no murmurs, rubs, or gallops. No clubbing, cyanosis, edema.  Radials/DP/PT 2+ and equal bilaterally.  Respiratory:  Respirations regular and unlabored, clear to auscultation bilaterally. GI: Soft, nontender, nondistended, BS + x 4. MS: no deformity or atrophy. Skin: warm and dry, no rash. Neuro:  Strength and sensation are intact. Psych: AAOx3.  Normal affect.  Labs    CBC  Recent Labs  05/24/16 0234 05/25/16 0249  WBC 13.2* 12.1*  HGB 11.6* 11.2*  HCT 34.8* 33.8*  MCV 88.8 88.7  PLT 392 [id number]   Basic Metabolic Panel  Recent Labs  05/23/16 0055 05/25/16 0249  NA 135 137  K 3.6 3.1*  CL 105 105  CO2 21* 23  GLUCOSE 74 109*  BUN 9 8  CREATININE 0.68 0.77  CALCIUM 8.2* 8.5*   Liver Function Tests No results for input(s): AST, ALT, ALKPHOS, BILITOT, PROT, ALBUMIN in the last 72 hours. No  results for input(s): LIPASE, AMYLASE in the last 72 hours. Cardiac Enzymes No results for input(s): CKTOTAL, CKMB, CKMBINDEX, TROPONINI in the last 72 hours. BNP Invalid input(s): POCBNP D-Dimer No results for input(s): DDIMER in the last 72 hours. Hemoglobin A1C No results for input(s): HGBA1C in the last 72 hours. Fasting Lipid Panel No results for input(s): CHOL, HDL, LDLCALC, TRIG, CHOLHDL, LDLDIRECT in the last 72 hours. Thyroid Function Tests No results for input(s): TSH, T4TOTAL, T3FREE, THYROIDAB in the last 72 hours.  Invalid input(s): [place] with controlled VR  ECG     - Personally Reviewed  Radiology    No results found.  Cardiac Studies   Study Conclusions  - Left ventricle: The cavity size was normal. There was   mild-moderate concentric hypertrophy. Systolic function was   mildly reduced. The estimated ejection fraction was in the range   of 45% to 50%. Diffuse hypokinesis. - Aortic valve:  Transvalvular velocity was within the normal range.   There was no stenosis. There was no regurgitation. - Mitral valve: Transvalvular velocity was within the normal range.   There was no evidence for stenosis. There was no regurgitation. - Right atrium: The atrium was moderately dilated. - Atrial septum: No defect or patent foramen ovale was identified   by color flow Doppler. - Tricuspid valve: There was mild regurgitation. - Pulmonary arteries: Systolic pressure was within the normal   range. PA peak pressure: 14 mm Hg (S). - Pericardium, extracardiac: A moderate pericardial effusion was   identified. The fluid contained focal strands near the RV apex   and possible thrombus concerning for hemopericadium. There was   mildright ventricular chamber collapse. Respirophasic change in   stroke volume was normal. Features were consistent with early   tamponade physiology.  Study Conclusions (Repeat echo)  - Left ventricle: The cavity size was normal. Wall thickness was   increased in a pattern of mild LVH. Systolic function was   moderately reduced. The estimated ejection fraction was in the   range of 35% to 40%. - Mitral valve: There was mild regurgitation. - Right ventricle: The cavity size was mildly dilated. Wall   thickness was normal. Systolic function was mildly to moderately   reduced. - Pericardium, extracardiac: Small to moderate pericardial   effusion, mostly seen around RV with some strandng Does not meet   echo criteria for tamponade Some stranding seen in pericardial   space. Compared to echo from yesterday it appears a little   smaller.   Patient Profile     Patient with no prior cardiac history with recent racoon bite s/p rabies vaccine treatments in August and since then has not felt well.  Has had SOB and chest discomfort off and on and he though it was related to the racoon bite but symptoms persisted and have gotten worse.  He noticed palpitations as well.   Came to ER and was in atrial flutter with RVR and started on IV Diltiazem.  WBC noted at 20.4 and normal TSH.  Chest CT showed left lowe segment PE with probable pulmonary infarct and moderate pericardial effusion.  Echo showed mild LV dysfunction with EF 45-50% ? Secondary to tachycardia induced CM from atrial flutter of unknown duration.  Repeat echo with EF 30-35% with smaller effusion.  Assessment & Plan    Pericardial effusion  Atrial flutter with rapid rate pesisting   Pulmonary embolism  Leukocytosis  Cardiomyopathy question mechanism  The patient has a plethora of issues and I am not sure what the unifying diagnosis is. From a therapeutic point of view, anticoagulation is indicated atrial flutter and pulmonary embolism. He needs TEE guidance and will need oral anticoagulation prior to discharge. The pulmonary embolism apixoban dose is  10 mg twice daily x 7 Days then 5 bid  . We will initiate later today.   Based on the recent trial from [facility] should be more than sufficient prior to his cardioversion. There has been concern about his pericardial effusion; Ihaving d discussed this with Dr. Eugene Garnet  will repeat the echo today prior to initiation of ELIQUIS.   We will continue him on amiodarone as there is a relatively high risk of recurrent atrial arrhythmias in the context of his pericarditis. Hopefully his atrial arrhythmias are secondary to human pericarditis or pulmonary embolism and long-term anticoagulation for this indication will not be necessary. I will defer the issues of long-term anticoagulation for his pulmonary embolism to his primary care team.  He will also need long-term therapy for his cardiomyopathy. He will need further evaluation of this probably is an outpatient including Myoview      Signed, Virl Axe, MD  05/25/2016, 10:14 AM

## 2016-05-25 NOTE — Progress Notes (Signed)
  Echocardiogram 2D Echocardiogram has been performed.  Johny Chess 05/25/2016, 4:50 PM

## 2016-05-25 NOTE — Progress Notes (Signed)
[facility] Progress Note  Patient: Carl Bush [id number]   PCP: No primary care provider on file. DOB: Jan 04, 1971   DOA: 05/20/2016   DOS: 05/25/2016   Date of Service: the patient was seen and examined on 05/25/2016  Brief hospital course: Pt. with PMH of Alcohol abuse; admitted on 05/20/2016, with complaint of shortness of breath and chest pain, was found to have pericardial effusion, pulmonary embolism with A. fib/A flutter in RVR. Cardiology consulted. Started on IV heparin.  Currently further plan is Continue close monitoring in the step down.  Assessment and Plan: 1. Acute unprovoked pulmonary embolism Pulmonary embolism with pulmonary infarct lower segmental PE. Currently on IV heparin. Continue heparin until pericardial effusion appears to be stabilizing. Echocardiogram without any right heart strain. Patient will need hypercoagulable workup in 3 months. Negative Dopplers lower extremity.  2. Viral pericarditis. Pericardial effusion. Patient's echocardiogram showed pericardial effusion which was thought to be secondary to pericarditis. Continues to have chest pain. Patient was started on colchicine since 0.6 twice a day. Repeat echocardiogram on 05/22/2016 shows smaller size of the effusion.  Highly appreciate cardiology input. Patient is at high risk for worsening of his pericardial effusion while on anticoagulation.  3. A. fib-A flutter with RVR. Cardiomyopathy of 35-40%. Patient's heart rate remains in 110S. Started on Lopressor 37.5 mg twice a day without any adequate heart rate control. On 05/23/2016 Patient started on IV amiodarone for heart rate control. Cardiology is planning TEE cardioversion on Monday.  4. Cardiomyopathy, likely alcohol induced. Patient's EF significantly lower. 45-50%, diffuse hypokinesis. Likely alcohol induced due to patient's heavy alcohol abuse. 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 Eventually patient will need ischemic workup.  5. Alcohol dependence. Patient drinks 12 packs every other day. His cardiomyopathies likely secondary from the same. Discontinue CIWA protocol. Continue to monitor in the step down unit.  6. smoker. Encourage cessation Nicotine patch ordered.  7. Suspected sepsis. Patient had fever tachycardia tachypnea as well as leukocytosis at admission. Patient was thought to be having sepsis likely secondary to pneumonia. Blood cultures are negative. Urine culture negative. MRSA PCR negative. HIV negative. Was on Vanco and Zosyn on admission. My suspicion for an ongoing active infection is significantly less. I would change him to Augmentin to finish a 7 day treatment course.  8. Constipation. Stool softener ordered.  Pain management: Morphine when necessary Activity: Will need physical therapy Bowel regimen: last BM 05/23/2016 Diet: Cardiac diet DVT Prophylaxis: on therapeutic anticoagulation.  Advance goals of care discussion: Full code  Family Communication: no family was present at bedside, at the time of interview.   Disposition:  Discharge to home. Expected discharge date: 05/27/2016  Consultants: Cardiology Procedures: Echocardiogram  Antibiotics: Anti-infectives    Start     Dose/Rate Route Frequency Ordered Stop   05/23/16 1400  amoxicillin-clavulanate (AUGMENTIN) 500-125 MG per tablet 500 mg     1 tablet Oral Every 8 hours 05/23/16 1014     05/21/16 0000  piperacillin-tazobactam (ZOSYN) IVPB 3.375 g  Status:  Discontinued     3.375 g 12.5 mL/hr over 240 Minutes Intravenous Every 8 hours 05/20/16 2047 05/23/16 1013   05/21/16 0000  vancomycin (VANCOCIN) IVPB 1000 mg/200 mL premix  Status:  Discontinued     1,000 mg 200 mL/hr over 60 Minutes Intravenous Every 8 hours 05/20/16 2047 05/22/16 1358   05/20/16 2045  piperacillin-tazobactam (ZOSYN) IVPB 3.375 g  Status:  Discontinued     3.375 g 100  mL/hr over  30 Minutes Intravenous  Once 05/20/16 2041 05/20/16 2044   05/20/16 2045  vancomycin (VANCOCIN) IVPB 1000 mg/200 mL premix  Status:  Discontinued     1,000 mg 200 mL/hr over 60 Minutes Intravenous  Once 05/20/16 2041 05/20/16 2044   05/20/16 1530  piperacillin-tazobactam (ZOSYN) IVPB 3.375 g     3.375 g 100 mL/hr over 30 Minutes Intravenous  Once 05/20/16 1524 05/20/16 1617   05/20/16 1530  vancomycin (VANCOCIN) IVPB 1000 mg/200 mL premix     1,000 mg 200 mL/hr over 60 Minutes Intravenous  Once 05/20/16 1524 05/20/16 1650      Subjective: calm denies any acute complaint. Chest pain totally resolved.  Objective: Physical Exam: Vitals:   05/25/16 1141 05/25/16 1200 05/25/16 1300 05/25/16 1601  BP: (!) 84/66 102/76 107/80 116/87  Pulse: 92 87 83 (!) 102  Resp: 16 20 (!) 21 (!) 23  Temp: 98.4 F (36.9 C)   98.7 F (37.1 C)  TempSrc: Oral   Oral  SpO2: 100% 99% 98% 99%  Weight:      Height:        Intake/Output Summary (Last 24 hours) at 05/25/16 1650 Last data filed at 05/25/16 1600  Gross per 24 hour  Intake          1655.34 ml  Output             1000 ml  Net           655.34 ml   Filed Weights   05/20/16 1700  Weight: 75.8 kg (167 lb)    General: Alert, Awake and Oriented to Time, Place and Person. Appear in mild distress, affect Appropriate Eyes: PERRL, Conjunctiva normal ENT: Oral Mucosa clear moist. Neck: no JVD, no Abnormal Mass Or lumps Cardiovascular: S1 and S2 Present, no Murmur, Respiratory: Bilateral Air entry equal and Decreased, no use of accessory muscle, Clear to Auscultation, no Crackles, no wheezes Abdomen: Bowel Sound present, Soft and no tenderness Skin: no redness, no Rash, no induration Extremities: no Pedal edema, no calf tenderness Neurologic: Grossly no focal neuro deficit. Bilaterally Equal motor strength  Data Reviewed: CBC:  Recent Labs Lab 05/20/16 1450 05/21/16 0713 05/22/16 0802 05/23/16 0603 05/24/16 0234 05/25/16 0249  WBC  20.4* 18.9* 14.8* 13.0* 13.2* 12.1*  NEUTROABS 16.2*  --   --   --   --   --   HGB 13.1 11.1* 11.4* 11.1* 11.6* 11.2*  HCT 39.5 34.2* 34.6* 33.6* 34.8* 33.8*  MCV 90.8 89.8 89.9 88.7 88.8 88.7  PLT 390 319 283 358 392 [id number]   Basic Metabolic Panel:  Recent Labs Lab 05/20/16 1450 05/21/16 0713 05/21/16 2333 05/23/16 0055 05/25/16 0249  NA 135 136 139 135 137  K 3.9 3.7 3.7 3.6 3.1*  CL 102 105 105 105 105  CO2 23 23 26  21* 23  GLUCOSE 138* 102* 96 74 109*  BUN 14 9 7 9 8   CREATININE 0.97 0.68 0.83 0.68 0.77  CALCIUM 8.8* 8.0* 8.4* 8.2* 8.5*    Liver Function Tests:  Recent Labs Lab 05/20/16 1450  AST 37  ALT 25  ALKPHOS 108  BILITOT 0.7  PROT 6.8  ALBUMIN 3.2*   No results for input(s): LIPASE, AMYLASE in the last 168 hours. No results for input(s): AMMONIA in the last 168 hours. Coagulation Profile:  Recent Labs Lab 05/20/16 2100  INR 1.40   Cardiac Enzymes: No results for input(s): CKTOTAL, CKMB, CKMBINDEX, TROPONINI in the last 168 hours.  BNP (last 3 results) No results for input(s): PROBNP in the last 8760 hours.  CBG:  Recent Labs Lab 05/20/16 1423  GLUCAP 154*    Studies: No results found.   Scheduled Meds: . amoxicillin-clavulanate  1 tablet Oral Q8H  . apixaban  10 mg Oral Q12H  . [START ON 06/02/2016] apixaban  5 mg Oral BID  . colchicine  0.6 mg Oral BID  . mouth rinse  15 mL Mouth Rinse BID  . metoprolol tartrate  37.5 mg Oral BID  . nicotine  21 mg Transdermal Daily  . polyethylene glycol  17 g Oral Daily  . senna-docusate  1 tablet Oral QHS  . sodium chloride flush  3 mL Intravenous Q12H  . sodium chloride flush  3 mL Intravenous Q12H   Continuous Infusions: . sodium chloride    . amiodarone 30 mg/hr (05/25/16 1632)  . heparin 2,500 Units/hr (05/25/16 1107)   PRN Meds: acetaminophen, morphine injection, ondansetron **OR** ondansetron (ZOFRAN) IV, sodium chloride flush, white petrolatum, zolpidem  Time spent: 30  minutes  Author: Berle Mull, MD [facility] Pager: [phone] 05/25/2016 4:50 PM  If 7PM-7AM, please contact night-coverage at [email or web address], password [email or web address]

## 2016-05-25 NOTE — Plan of Care (Signed)
Problem: Tissue Perfusion: Goal: Risk factors for ineffective tissue perfusion will decrease Outcome: Progressing Positive for PEs on admission, IV heparin, PO eliquis will be initiated tonight per pharmacy orders

## 2016-05-25 NOTE — Progress Notes (Signed)
ANTICOAGULATION CONSULT NOTE - Follow Up Consult  Pharmacy Consult for heparin>apixaban Indication: pulmonary embolus / New Afib  No Known Allergies  Patient Measurements: Height: 6\' 2"  (188 cm) (per pt report) Weight: 167 lb (75.8 kg) (per pt report) IBW/kg (Calculated) : 82.2 Heparin Dosing Weight: 75.8 kg  Vital Signs: Temp: 98.4 F (36.9 C) (11/19 1141) Temp Source: Oral (11/19 1141) BP: 107/80 (11/19 1300) Pulse Rate: 83 (11/19 1300)  Labs:  Recent Labs  05/23/16 0055  05/23/16 0603 05/24/16 0234 05/25/16 0249  HGB  --   < > 11.1* 11.6* 11.2*  HCT  --   --  33.6* 34.8* 33.8*  PLT  --   --  358 392 397  HEPARINUNFRC  --   --  0.35 0.33 0.47  CREATININE 0.68  --   --   --  0.77  < > = values in this interval not displayed.  Estimated Creatinine Clearance: 127.6 mL/min (by C-G formula based on SCr of 0.77 mg/dL).   Assessment: Carl Bush is a 45 yo male who presented on 11/14 with chest pain and shortness of breath. He is currently being treated with heparin for a pulmonary embolus and new Afib. Of note, his Echo on 11/15 was concerning for a clot in the pericardium and conversion to hemopericardium. Repeat echo from 11/16 shows smaller area of effusion. It is suspected that he also has acute pericarditis from a viral syndrome. - started on colchicine.  The patient's heparin level therapeutic 0.47 heparin drip rate 2500 uts/hr. CBC stable Plan for TEE/DCCV 11/20 will plan to change from heparin to apixaban tonight prior to DCCV.    Goal of Therapy:  Heparin level 0.3-0.7 units/ml Monitor platelets by anticoagulation protocol: Yes   Plan:  Continue  Heparin to 2500 units/hr stop at 2100 this evening Apixaban 10mg  BID x7 days for PE then 5mg  BID - begin tonight Will continue to monitor for any signs/symptoms of bleeding   Bonnita Nasuti Pharm.D. CPP, BCPS Clinical Pharmacist [phone] 05/25/2016 2:08 PM

## 2016-05-26 ENCOUNTER — Inpatient Hospital Stay (HOSPITAL_COMMUNITY): Payer: BLUE CROSS/BLUE SHIELD | Admitting: Anesthesiology

## 2016-05-26 ENCOUNTER — Encounter (HOSPITAL_COMMUNITY)
Admission: EM | Disposition: A | Discharge status: Home / Self Care | Payer: Self-pay | Source: Home / Self Care | Attending: Internal Medicine

## 2016-05-26 ENCOUNTER — Inpatient Hospital Stay (HOSPITAL_COMMUNITY): Payer: BLUE CROSS/BLUE SHIELD

## 2016-05-26 ENCOUNTER — Encounter (HOSPITAL_COMMUNITY): Payer: Self-pay | Admitting: Anesthesiology

## 2016-05-26 DIAGNOSIS — I4892 Unspecified atrial flutter: Secondary | ICD-10-CM

## 2016-05-26 DIAGNOSIS — I483 Typical atrial flutter: Secondary | ICD-10-CM

## 2016-05-26 HISTORY — PX: CARDIOVERSION: SHX1299

## 2016-05-26 HISTORY — PX: TEE WITHOUT CARDIOVERSION: SHX5443

## 2016-05-26 LAB — CBC
HCT: 35 % — ABNORMAL LOW (ref 39.0–52.0)
Hemoglobin: 11.7 g/dL — ABNORMAL LOW (ref 13.0–17.0)
MCH: 29.7 pg (ref 26.0–34.0)
MCHC: 33.4 g/dL (ref 30.0–36.0)
MCV: 88.8 fL (ref 78.0–100.0)
PLATELETS: 503 10*3/uL — AB (ref 150–400)
RBC: 3.94 MIL/uL — AB (ref 4.22–5.81)
RDW: 13.9 % (ref 11.5–15.5)
WBC: 14.3 10*3/uL — AB (ref 4.0–10.5)

## 2016-05-26 LAB — BASIC METABOLIC PANEL
ANION GAP: 9 (ref 5–15)
BUN: 7 mg/dL (ref 6–20)
CHLORIDE: 105 mmol/L (ref 101–111)
CO2: 23 mmol/L (ref 22–32)
Calcium: 8.8 mg/dL — ABNORMAL LOW (ref 8.9–10.3)
Creatinine, Ser: 0.75 mg/dL (ref 0.61–1.24)
GFR calc Af Amer: >60 mL/min (ref >60)
Glucose, Bld: 99 mg/dL (ref 65–99)
POTASSIUM: 3.3 mmol/L — AB (ref 3.5–5.1)
Sodium: 137 mmol/L (ref 135–145)

## 2016-05-26 LAB — MAGNESIUM: MAGNESIUM: 1.8 mg/dL (ref 1.7–2.4)

## 2016-05-26 SURGERY — ECHOCARDIOGRAM, TRANSESOPHAGEAL
Anesthesia: General

## 2016-05-26 MED ORDER — LACTATED RINGERS IV SOLN
INTRAVENOUS | Status: DC
  Administered 2016-05-26: 13:00:00 via INTRAVENOUS

## 2016-05-26 MED ORDER — PROPOFOL 10 MG/ML IV BOLUS
INTRAVENOUS | Status: DC | PRN
  Administered 2016-05-26: 30 mg via INTRAVENOUS
  Administered 2016-05-26: 40 mg via INTRAVENOUS
  Administered 2016-05-26: 30 mg via INTRAVENOUS

## 2016-05-26 MED ORDER — NICOTINE 21 MG/24HR TD PT24: 21.0000 mg | MEDICATED_PATCH | Freq: Every day | TRANSDERMAL | 0 refills | Status: DC

## 2016-05-26 MED ORDER — SODIUM CHLORIDE 0.9 % IV SOLN: INTRAVENOUS | Status: DC

## 2016-05-26 MED ORDER — AMOXICILLIN-POT CLAVULANATE 500-125 MG PO TABS: 1.0000 | ORAL_TABLET | Freq: Three times a day (TID) | ORAL | 0 refills | Status: AC

## 2016-05-26 MED ORDER — AMIODARONE HCL IN DEXTROSE 360-4.14 MG/200ML-% IV SOLN
INTRAVENOUS | Status: DC | PRN
  Administered 2016-05-26: 30 mg/h via INTRAVENOUS

## 2016-05-26 MED ORDER — PROPOFOL 500 MG/50ML IV EMUL
INTRAVENOUS | Status: DC | PRN
  Administered 2016-05-26: 150 ug/kg/min via INTRAVENOUS

## 2016-05-26 MED ORDER — APIXABAN 5 MG PO TABS: ORAL_TABLET | ORAL | 0 refills | Status: DC

## 2016-05-26 MED ORDER — AMIODARONE HCL 200 MG PO TABS: ORAL_TABLET | ORAL | 0 refills | Status: DC

## 2016-05-26 MED ORDER — METOPROLOL TARTRATE 25 MG PO TABS: 25.0000 mg | ORAL_TABLET | Freq: Two times a day (BID) | ORAL | 0 refills | Status: DC

## 2016-05-26 MED ORDER — AMIODARONE HCL 200 MG PO TABS
400.0000 mg | ORAL_TABLET | Freq: Two times a day (BID) | ORAL | Status: DC
  Administered 2016-05-26: 400 mg via ORAL
  Filled 2016-05-26: qty 2

## 2016-05-26 MED ORDER — POTASSIUM CHLORIDE CRYS ER 20 MEQ PO TBCR
40.0000 meq | EXTENDED_RELEASE_TABLET | Freq: Once | ORAL | Status: AC
  Administered 2016-05-26: 40 meq via ORAL
  Filled 2016-05-26: qty 2

## 2016-05-26 MED ORDER — SENNOSIDES-DOCUSATE SODIUM 8.6-50 MG PO TABS: 1.0000 | ORAL_TABLET | Freq: Every day | ORAL | 0 refills | Status: DC

## 2016-05-26 MED ORDER — COLCHICINE 0.6 MG PO TABS: 0.6000 mg | ORAL_TABLET | Freq: Two times a day (BID) | ORAL | 0 refills | Status: DC

## 2016-05-26 MED ORDER — LIDOCAINE HCL (CARDIAC) 20 MG/ML IV SOLN
INTRAVENOUS | Status: DC | PRN
  Administered 2016-05-26: 50 mg via INTRAVENOUS

## 2016-05-26 MED ORDER — BUTAMBEN-TETRACAINE-BENZOCAINE 2-2-14 % EX AERO
INHALATION_SPRAY | CUTANEOUS | Status: DC | PRN
  Administered 2016-05-26: 2 via TOPICAL

## 2016-05-26 MED ORDER — POLYETHYLENE GLYCOL 3350 17 G PO PACK: 17.0000 g | PACK | Freq: Every day | ORAL | 0 refills | Status: DC

## 2016-05-26 NOTE — Op Note (Signed)
Procedure: Electrical Cardioversion Indications:  Atrial Flutter  Procedure Details:  Consent: Risks of procedure as well as the alternatives and risks of each were explained to the (patient/caregiver).  Consent for procedure obtained.  Time Out: Verified patient identification, verified procedure, site/side was marked, verified correct patient position, special equipment/implants available, medications/allergies/relevent history reviewed, required imaging and test results available.  Performed  Patient placed on cardiac monitor, pulse oximetry, supplemental oxygen as necessary.  Sedation given: IV propofol Pacer pads placed anterior and posterior chest.  Cardioverted 1 time(s).  Cardioversion with synchronized biphasic 120J shock.  Evaluation: Findings: Post procedure EKG shows: Atrial Fibrillation Complications: None Patient did tolerate procedure well.  Time Spent Directly with the Patient:  30 minutes   Hazelyn Kallen 05/26/2016, 1:34 PM

## 2016-05-26 NOTE — Progress Notes (Addendum)
Left pt 30day free and eliquis copay card in room. Pt has bcbs ins. Pt has bcbs ins. He needs to call phy practices near where he works or lives or also call customer service to get list of providers in [facility] network.

## 2016-05-26 NOTE — Progress Notes (Signed)
  Echocardiogram Echocardiogram Transesophageal has been performed.  Carl Bush 05/26/2016, 1:47 PM

## 2016-05-26 NOTE — Progress Notes (Signed)
Hospital Problem List     Principal Problem:   Pulmonary embolism ([place]) Active Problems:   Sepsis ([place])   Tobacco use disorder   Alcohol dependence ([place])   Atrial flutter ([facility])   Tachycardia   Effusion, pericardium   Pericardial effusion   DCM (dilated cardiomyopathy) [facility])     Patient Profile:   Primary Cardiologist: Dr. Radford Pax  Patient with no prior cardiac history with recent racoon bite s/p rabies vaccine treatments in August and since then has not felt well. Has had SOB and chest discomfort off and on and he though it was related to the racoon bite but symptoms persisted and have gotten worse. He noticed palpitations as well. Came to ER and was in atrial flutter with RVR and started on IV Diltiazem. WBC noted at 20.4 and normal TSH. Chest CT showed left lowe segment PE with probable pulmonary infarct and moderate pericardial effusion. Echo showed mild LV dysfunction with EF 45-50% ? Secondary to tachycardia induced CM from atrial flutter of unknown duration.  Repeat echo with EF 30-35% with smaller effusion. Repeat ECHO again with resolved effusion and EF improved to 40-45%.  Subjective   No events overnight. Denies any further chest pain, shortness of breath, palpitations.   Inpatient Medications    . amoxicillin-clavulanate  1 tablet Oral Q8H  . apixaban  10 mg Oral Q12H  . [START ON 06/02/2016] apixaban  5 mg Oral BID  . colchicine  0.6 mg Oral BID  . mouth rinse  15 mL Mouth Rinse BID  . metoprolol tartrate  37.5 mg Oral BID  . nicotine  21 mg Transdermal Daily  . polyethylene glycol  17 g Oral Daily  . potassium chloride  40 mEq Oral Once  . senna-docusate  1 tablet Oral QHS  . sodium chloride flush  3 mL Intravenous Q12H  . sodium chloride flush  3 mL Intravenous Q12H    Vital Signs    Vitals:   05/26/16 0000 05/26/16 0100 05/26/16 0200 05/26/16 0400  BP: 104/66 104/75 109/81 97/72  Pulse: 74 99  100  Resp: 17 (!) 22 18 20   Temp:    98.2 F  (36.8 C)  TempSrc:    Oral  SpO2: 100% 95%  100%  Weight:      Height:        Intake/Output Summary (Last 24 hours) at 05/26/16 0818 Last data filed at 05/26/16 0400  Gross per 24 hour  Intake             1114 ml  Output              400 ml  Net              714 ml   Filed Weights   05/20/16 1700  Weight: 167 lb (75.8 kg)    Physical Exam    GEN: Well nourished, well developed, in no acute distress.  HEENT: Grossly normal.  Neck: Supple, no JVD, carotid bruits, or masses. Cardiac: RRR tachy, no murmurs, rubs, or gallops. No clubbing, cyanosis, edema.  Radials/DP/PT 2+ and equal bilaterally.  Respiratory:  Respirations regular and unlabored, clear to auscultation bilaterally. GI: Soft, nontender, nondistended, BS + x 4. MS: no deformity or atrophy. Skin: warm and dry, no rash. Neuro:  Strength and sensation are intact. Psych: AAOx3.  Normal affect.  Labs    CBC  Recent Labs  05/25/16 0249 05/26/16 0143  WBC 12.1* 14.3*  HGB 11.2* 11.7*  HCT  33.8* 35.0*  MCV 88.7 88.8  PLT 397 [id number]*   Basic Metabolic Panel  Recent Labs  05/25/16 0249 05/26/16 0143  NA 137 137  K 3.1* 3.3*  CL 105 105  CO2 23 23  GLUCOSE 109* 99  BUN 8 7  CREATININE 0.77 0.75  CALCIUM 8.5* 8.8*  MG  --  1.8    Telemetry    A. Flutter with controlled rate   Cardiac Studies and Radiology    ECHO (05/21/16) Study Conclusions  - Left ventricle: The cavity size was normal. There was mild-moderate concentric hypertrophy. Systolic function was mildly reduced. The estimated ejection fraction was in the range of 45% to 50%. Diffuse hypokinesis. - Aortic valve: Transvalvular velocity was within the normal range. There was no stenosis. There was no regurgitation. - Mitral valve: Transvalvular velocity was within the normal range. There was no evidence for stenosis. There was no regurgitation. - Right atrium: The atrium was moderately dilated. - Atrial septum: No defect or  patent foramen ovale was identified by color flow Doppler. - Tricuspid valve: There was mild regurgitation. - Pulmonary arteries: Systolic pressure was within the normal range. PA peak pressure: 14 mm Hg (S). - Pericardium, extracardiac: A moderate pericardial effusion was identified. The fluid contained focal strands near the RV apex and possible thrombus concerning for hemopericadium. There was mildright ventricular chamber collapse. Respirophasic change in stroke volume was normal. Features were consistent with early tamponade physiology.  ECHO (Repeat 05/22/16) Study Conclusions   - Left ventricle: The cavity size was normal. Wall thickness was increased in a pattern of mild LVH. Systolic function was moderately reduced. The estimated ejection fraction was in the range of 35% to 40%. - Mitral valve: There was mild regurgitation. - Right ventricle: The cavity size was mildly dilated. Wall thickness was normal. Systolic function was mildly to moderately reduced. - Pericardium, extracardiac: Small to moderate pericardial effusion, mostly seen around RV with some strandng Does not meet echo criteria for tamponade Some stranding seen in pericardial space. Compared to echo from yesterday it appears a little smaller.  ECHO (Repeat 05/25/16) Study Conclusions  - Left ventricle: The cavity size was normal. Systolic function was   mildly to moderately reduced. The estimated ejection fraction was   in the range of 40% to 45%. Diffuse hypokinesis. The study is not   technically sufficient to allow evaluation of LV diastolic   function. - Mitral valve: There was mild regurgitation. - Right ventricle: Systolic function was mildly reduced.  Impressions:  - Compared to prior echo, the pericardial effusion has resolved and   LVF appears improved at 40-45%  Assessment & Plan    Pericardial effusion This was likely acute pericarditis 2/2 viral  etiology. There was some concern for possible hemopericardium due to his Heparin for his PE. Had repeat ECHO done yesterday prior to starting Eliquis Resolved on repeat ECHO done yesterday. Continue Colchicine 0.6mg  BID.   A flutter RVR with abnormal EKG difficult to eval ST due to flutter waves Was initially started on lopressor 37.5mg  BID but HR not well controlled. Given LV dysfunction, need to avoid use of cardizem.  Soft BP limits uptitration of BB. Started on IV Amio (11/17) with improvement in HR. Will continue amiodarone as there is a relatively high risk of recurrent atrial arrhythmias in the context of his pericarditis.  Plan for TEE today with potential cardioversion thereafter if no evidence of clot.   Pulmonary embolism  No rt heart strain On Elqiuis  Cardiomyopathy etoh vs viral EF 40-45% Will need further evaluation outpatient with likely a Myoview for ischemic evaluation   Signed, Maryellen Pile ,MD PGY-2  8:18 AM 05/26/2016   I have seen, examined and evaluated the patient this AM along with Dr. Charlynn Grimes, [place].  After reviewing all the available data and chart, we discussed the patients laboratory, study & physical findings as well as symptoms in detail. I agree with his findings, examination as well as impression recommendations as per our discussion.    Patient actually looks much better this morning. A. fib/flutter rate is relatively well-controlled - question if the pericardial effusion with pericarditis triggered the A. Fib versus viral syndrome.  Plan as laid out over the weekend is for attempted TEE cardioversion today. Provided that is successful, would switch to oral amiodarone for at least one month coverage: Would write for 400 mg twice a day for 1 week followed by 200 mg twice a day for 1 week and then 200 mg daily until seen in follow-up. He will also be on Eliquis treatment dose for PE which should cover for stroke prophylaxis. - He will be on anticoagulation  for at least 6 months for PE, that which should be more than Dr. covert post cardioversion. I agree that with a reduced EF would probably benefit from outpatient ischemic evaluation once he is recovered from his acute illness. Provided he has stable cardioversion today we switched to by mouth amiodarone, from a cardiology standpoint will be stable for discharge.   Glenetta Hew, M.D., M.S. Interventional Cardiologist   Pager # [phone] Phone # [phone] [address] [place], [place] [address]

## 2016-05-26 NOTE — Anesthesia Preprocedure Evaluation (Addendum)
Anesthesia Evaluation  Patient identified by MRN, date of birth, ID band Patient awake    Reviewed: Allergy & Precautions, NPO status , Patient's Chart, lab work & pertinent test results  Airway Mallampati: II  TM Distance: >3 FB Neck ROM: Full    Dental  (+) Teeth Intact, Dental Advisory Given   Pulmonary Current Smoker,    breath sounds clear to auscultation       Cardiovascular negative cardio ROS  + dysrhythmias Atrial Fibrillation  Rhythm:Irregular Rate:Tachycardia     Neuro/Psych PSYCHIATRIC DISORDERS negative neurological ROS     GI/Hepatic negative GI ROS, Neg liver ROS, (+)     substance abuse  alcohol use and marijuana use,   Endo/Other  negative endocrine ROS  Renal/GU negative Renal ROS  negative genitourinary   Musculoskeletal negative musculoskeletal ROS (+)   Abdominal   Peds negative pediatric ROS (+)  Hematology negative hematology ROS (+)   Anesthesia Other Findings   Reproductive/Obstetrics negative OB ROS                          05/2016 EKG: atrial flutter.  - Left ventricle: The cavity size was normal. Systolic function was   mildly to moderately reduced. The estimated ejection fraction was   in the range of 40% to 45%. Diffuse hypokinesis. The study is not   technically sufficient to allow evaluation of LV diastolic   function. - Mitral valve: There was mild regurgitation. - Right ventricle: Systolic function was mildly reduced.  Anesthesia Physical Anesthesia Plan  ASA: III  Anesthesia Plan: General   Post-op Pain Management:    Induction: Intravenous  Airway Management Planned: Mask  Additional Equipment:   Intra-op Plan:   Post-operative Plan:   Informed Consent: I have reviewed the patients History and Physical, chart, labs and discussed the procedure including the risks, benefits and alternatives for the proposed anesthesia with the patient  or authorized representative who has indicated his/her understanding and acceptance.   Dental advisory given  Plan Discussed with: CRNA  Anesthesia Plan Comments:         Anesthesia Quick Evaluation

## 2016-05-26 NOTE — Progress Notes (Signed)
Pt being discharged home via wheelchair with family. Pt alert and oriented x4. VSS. Pt c/o no pain at this time. No signs of respiratory distress. Education complete and care plans resolved. IV removed with catheter intact and pt tolerated well. No further issues at this time. Pt to follow up with PCP. Nafis Farnan R, RN 

## 2016-05-26 NOTE — Op Note (Signed)
INDICATIONS: atrial flutter  PROCEDURE:   Informed consent was obtained prior to the procedure. The risks, benefits and alternatives for the procedure were discussed and the patient comprehended these risks.  Risks include, but are not limited to, cough, sore throat, vomiting, nausea, somnolence, esophageal and stomach trauma or perforation, bleeding, low blood pressure, aspiration, pneumonia, infection, trauma to the teeth and death.    After a procedural time-out, the oropharynx was anesthetized with 20% benzocaine spray.   During this procedure the patient was administered IV propofol (Anesthesiology, Dr. Smith Robert) for sedation.    The transesophageal probe was inserted in the esophagus and stomach without difficulty and multiple views were obtained.  The patient was kept under observation until the patient left the procedure room.  The patient left the procedure room in stable condition.   Agitated microbubble saline contrast was not administered.  COMPLICATIONS:    There were no immediate complications.  FINDINGS:  Normal TEE. Left pleural effusion and lung consolidation seen.  RECOMMENDATIONS:    Proceed with DCCV.  Time Spent Directly with the Patient:  30 minutes   Carl Bush 05/26/2016, 1:35 PM

## 2016-05-26 NOTE — Anesthesia Postprocedure Evaluation (Signed)
Anesthesia Post Note  Patient: Carl Bush  Procedure(s) Performed: Procedure(s) (LRB): TRANSESOPHAGEAL ECHOCARDIOGRAM (TEE) (N/A) CARDIOVERSION (N/A)  Patient location during evaluation: PACU Anesthesia Type: General Level of consciousness: awake and alert Pain management: pain level controlled Vital Signs Assessment: post-procedure vital signs reviewed and stable Respiratory status: spontaneous breathing, nonlabored ventilation, respiratory function stable and patient connected to nasal cannula oxygen Cardiovascular status: blood pressure returned to baseline and stable Postop Assessment: no signs of nausea or vomiting Anesthetic complications: no    Last Vitals:  Vitals:   05/26/16 1400 05/26/16 1410  BP: 101/67 102/65  Pulse: 64 65  Resp: 18 15  Temp:      Last Pain:  Vitals:   05/26/16 1345  TempSrc: Oral  PainSc:                  Effie Berkshire

## 2016-05-26 NOTE — Transfer of Care (Signed)
Immediate Anesthesia Transfer of Care Note  Patient: Carl Bush  Procedure(s) Performed: Procedure(s): TRANSESOPHAGEAL ECHOCARDIOGRAM (TEE) (N/A) CARDIOVERSION (N/A)  Patient Location: Endoscopy Unit  Anesthesia Type:General  Level of Consciousness: awake, alert , oriented and patient cooperative  Airway & Oxygen Therapy: Patient Spontanous Breathing and Patient connected to nasal cannula oxygen  Post-op Assessment: Report given to RN and Post -op Vital signs reviewed and stable  Post vital signs: Reviewed  Last Vitals:  Vitals:   05/26/16 1200 05/26/16 1222  BP: 125/69 115/77  Pulse: (!) 111 97  Resp: 17 13  Temp:  36.6 C    Last Pain:  Vitals:   05/26/16 1222  TempSrc: Oral  PainSc:       Patients Stated Pain Goal: 0 ([id number])  Complications: No apparent anesthesia complications

## 2016-05-27 ENCOUNTER — Encounter (HOSPITAL_COMMUNITY): Payer: Self-pay | Admitting: Cardiovascular Disease

## 2016-05-28 NOTE — Discharge Summary (Signed)
[facility] Discharge Summary   Patient: Carl Bush [id number]   PCP: No primary care provider on file. DOB: 1971/06/20   Date of admission: 05/20/2016   Date of discharge: 05/26/2016     Discharge Diagnoses:  Principal Problem:   Pulmonary embolism ([place]) Active Problems:   Sepsis ([place])   Tobacco use disorder   Alcohol dependence ([place])   Atrial flutter ([facility])   Tachycardia   Effusion, pericardium   Pericardial effusion   DCM (dilated cardiomyopathy) ([place])   Admitted From: home Disposition:  home  Recommendations for Outpatient Follow-up:  1. Please follow up with cardiology as recommended  2. Please follow up with PCP in 1 week   Follow-up Information    PCP. Schedule an appointment as soon as possible for a visit in 1 week(s).   Why:  please follow up in 1 week. get hypercoagulable work up done in 3 month. decide regarding continuation of blood thinner after 6 months.          Diet recommendation: cardiac diet  Activity: The patient is advised to gradually reintroduce usual activities.  Discharge Condition: good  Code Status: full code  History of present illness: As per the H and P dictated on admission, "Carl Bush is a 46 y.o. male with no significant past medical history presenting with chest pain, SOB.    Since September, he has noticed upper chest pain with any exertion.  Light-headed.  SOB.  Occasional leg spasms.  Pain in upper chest and down below rib cage.  Uncertain what might have brought it on - initially attributed it to the rabies series, which he finished on 7/28 after having been bitten by a rabid raccoon.  Pain is constant, SOB is also constant.  Last night, his legs spasmed and he got dizzy, light-headed, chest pain, SOB - had to drop onto the kitchen floor to recover. No weight changes.  Fever to 100.5 in ER, thinks he also has been febrile at home for especially the last 2 nights.  Slight cough, nonproductive.  +rhinorrhea typical  for this time of year.   Thinks he has a problem with ETOH.  20+ year history, drinks 12 pack QOD.  Longest period of sobriety 6 months.  No h/o withdrawal/DTs.  Does not desire to quit drinking."  Hospital Course:   Summary of his active problems in the hospital is as following. 1. Acute unprovoked pulmonary embolism Pulmonary embolism with pulmonary infarct lower segmental PE. Initially on IV heparin. Later on changed to apixaban. Echocardiogram without any right heart strain. Patient will need hypercoagulable workup in 3 months. Negative Dopplers lower extremity.  2. Viral pericarditis. Pericardial effusion. Patient's echocardiogram showed pericardial effusion which was thought to be secondary to pericarditis. Continues to have chest pain. Patient was started on colchicine since 0.6 twice a day. Repeat echocardiogram on 05/22/2016 shows smaller size of the effusion.  Highly appreciate cardiology input. Patient is at high risk for worsening of his pericardial effusion while on anticoagulation.  3. A. fib-A flutter with RVR. Cardiomyopathy of 35-40%. Started on Lopressor 37.5 mg twice a day without any adequate heart rate control. On 05/23/2016 Patient started on IV amiodarone for heart rate control. Pt underwent TEE cardioversion on Monday. Heart rate well controlled and pt will remain on amiodarone.  4. Cardiomyopathy, likely alcohol induced. Patient's EF significantly lower. 45-50%, diffuse hypokinesis. Likely alcohol induced due to patient's heavy alcohol abuse. 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 Eventually patient  will need ischemic workup.  5. Alcohol dependence. Patient drinks 12 packs every other day. His cardiomyopathies likely secondary from the same. Discontinue CIWA protocol.  6. smoker. Encourage cessation Nicotine patch ordered.  7. Suspected sepsis. Patient had fever tachycardia tachypnea as well as  leukocytosis at admission. Patient was thought to be having sepsis likely secondary to pneumonia. Blood cultures are negative. Urine culture negative. MRSA PCR negative. HIV negative. Was on Vanco and Zosyn on admission. My suspicion for an ongoing active infection is significantly less. I would change him to Augmentin to finish a 7 day treatment course.  8. Constipation. Stool softener ordered.  All other chronic medical condition were stable during the hospitalization.  Patient was ambulatory without any assistance. On the day of the discharge the patient's vitals were stable, and no other acute medical condition were reported by patient. the patient was felt safe to be discharge at home with family.  Procedures and Results:  Echocardiogram    Consultations:  Cardiology  DISCHARGE MEDICATION: Discharge Medication List as of 05/26/2016  5:35 PM    START taking these medications   Details  amiodarone (PACERONE) 200 MG tablet Take 400 mg twice a day for 1 week followed by 200 mg twice a day., Normal    amoxicillin-clavulanate (AUGMENTIN) 500-125 MG tablet Take 1 tablet (500 mg total) by mouth every 8 (eight) hours., Starting Mon 05/26/2016, Until Tue 05/27/2016, Normal    apixaban (ELIQUIS) 5 MG TABS tablet Take 10 mg twice a day till 06/02/16, take 5 mg twice a day after that., Normal    colchicine 0.6 MG tablet Take 1 tablet (0.6 mg total) by mouth 2 (two) times daily., Starting Mon 05/26/2016, Normal    Metoprolol Tartrate (LOPRESSOR) 25 MG tablet Take 1 tablet (25 mg total) by mouth 2 (two) times daily., Starting Mon 05/26/2016, Normal    nicotine (NICODERM CQ - DOSED IN MG/24 HOURS) 21 mg/24hr patch Place 1 patch (21 mg total) onto the skin daily., Starting Tue 05/27/2016, Normal    polyethylene glycol (MIRALAX / GLYCOLAX) packet Take 17 g by mouth daily., Starting Tue 05/27/2016, Normal    senna-docusate (SENOKOT-S) 8.6-50 MG tablet Take 1 tablet by mouth at bedtime.,  Starting Mon 05/26/2016, Normal      STOP taking these medications     ibuprofen (ADVIL,MOTRIN) 200 MG tablet        No Known Allergies Discharge Instructions    Diet - low sodium heart healthy    Complete by:  As directed    Discharge instructions    Complete by:  As directed    It is important that you read following instructions as well as go over your medication list with RN to help you understand your care after this hospitalization.  Discharge Instructions: Please follow-up with PCP in one week  Please request your primary care physician to go over all Hospital Tests and Procedure/Radiological results at the follow up,  Please get all Hospital records sent to your PCP by signing hospital release before you go home.   Do not take more than prescribed Pain, Sleep and Anxiety Medications. You were cared for by a hospitalist during your hospital stay. If you have any questions about your discharge medications or the care you received while you were in the hospital after you are discharged, you can call the unit and ask to speak with the hospitalist on call if the hospitalist that took care of you is not available.  Once you are discharged,  your primary care physician will handle any further medical issues. Please note that NO REFILLS for any discharge medications will be authorized once you are discharged, as it is imperative that you return to your primary care physician (or establish a relationship with a primary care physician if you do not have one) for your aftercare needs so that they can reassess your need for medications and monitor your lab values. You Must read complete instructions/literature along with all the possible adverse reactions/side effects for all the Medicines you take and that have been prescribed to you. Take any new Medicines after you have completely understood and accept all the possible adverse reactions/side effects. Wear Seat belts while driving. If you  have smoked or chewed Tobacco in the last 2 yrs please stop smoking and/or stop any Recreational drug use.   Increase activity slowly    Complete by:  As directed      Discharge Exam: Filed Weights   05/20/16 1700 05/26/16 1222  Weight: 75.8 kg (167 lb) 75.8 kg (167 lb)   Vitals:   05/26/16 1614 05/26/16 1700  BP: 113/80 96/70  Pulse: 87 82  Resp: 20 17  Temp: 97.3 F (36.3 C)    General: Appear in no distress, no Rash; Oral Mucosa moist. Cardiovascular: S1 and S2 Present, no Murmur, no JVD Respiratory: Bilateral Air entry present and Clear to Auscultation, no Crackles, no wheezes Abdomen: Bowel Sound present, Soft and no tenderness Extremities: no Pedal edema, no calf tenderness Neurology: Grossly no focal neuro deficit.  The results of significant diagnostics from this hospitalization (including imaging, microbiology, ancillary and laboratory) are listed below for reference.    Significant Diagnostic Studies: Ct Angio Chest Pe W Or Wo Contrast  Result Date: 05/20/2016 CLINICAL DATA:  45 y/o M; upper chest pain and shortness of breath. History of smoking. EXAM: CT ANGIOGRAPHY CHEST WITH CONTRAST TECHNIQUE: Multidetector CT imaging of the chest was performed using the standard protocol during bolus administration of intravenous contrast. Multiplanar CT image reconstructions and MIPs were obtained to evaluate the vascular anatomy. CONTRAST:  70 cc Isovue 370 COMPARISON:  None. FINDINGS: Cardiovascular: Satisfactory opacification of pulmonary arteries to the segmental level. Multiple segmental pulmonary arteries of the left lung base are occluded consistent with pulmonary embolus. No evidence for right heart strain. Normal heart size. Moderate pericardial effusion. Mild coronary artery calcifications. Normal caliber main pulmonary artery. Normal caliber thoracic aorta. Mediastinum/Nodes: Prominent subcentimeter mediastinal lymph nodes are likely reactive. Thyroid gland, trachea, and  esophagus demonstrate no significant findings. Lungs/Pleura: Mild centrilobular emphysema. Ill-defined ground-glass opacity at the left lung base in region of multiple occluded segmental pulmonary emboli. Small left-sided pleural effusion. Upper Abdomen: Gastrohepatic ligament lymphadenopathy of uncertain significance. Possible periportal edema. Musculoskeletal: No chest wall abnormality. No acute or significant osseous findings. Review of the MIP images confirms the above findings. IMPRESSION: 1. Left lower lobe segmental pulmonary emboli. 2. Ground-glass opacity in the left lower lobe in region of pulmonary emboli is probably a developing pulmonary infarct, less likely pneumonia. 3. Small left-sided pleural effusion. 4. Moderate pericardial effusion. 5. Gastrohepatic ligament lymphadenopathy and possible periportal edema in the liver of uncertain significance, partially visualized. These results were called by telephone at the time of interpretation on 05/20/2016 at 4:31 pm to Dr. Vanita Panda, who verbally acknowledged these results. Electronically Signed   By: Kristine Garbe M.D.   On: 05/20/2016 16:35   Dg Chest Port 1 View  Result Date: 05/20/2016 CLINICAL DATA:  Increasing chest pain and shortness  of breath, fluttering in chest over last couple weeks, smoker EXAM: PORTABLE CHEST 1 VIEW COMPARISON:  Portable exam 1501 hours without priors for comparison. FINDINGS: Minimal enlargement of cardiac silhouette. Mediastinal contours and pulmonary vascularity normal. Suspicion of retrocardiac LEFT lower lobe infiltrate with loss of the LEFT diaphragmatic silhouette. Remaining lungs clear. No pleural effusion or pneumothorax. Bones unremarkable. IMPRESSION: Minimal enlargement of cardiac silhouette. Question LEFT lower lobe consolidation/pneumonia. Electronically Signed   By: Lavonia Dana M.D.   On: 05/20/2016 15:19    Microbiology: Recent Results (from the past 240 hour(s))  Culture, blood (Routine X  2) w Reflex to ID Panel     Status: None   Collection Time: 05/20/16  2:50 PM  Result Value Ref Range Status   Specimen Description BLOOD RIGHT FOREARM  Final   Special Requests BOTTLES DRAWN AEROBIC AND ANAEROBIC 5CC  Final   Culture NO GROWTH 5 DAYS  Final   Report Status 05/25/2016 FINAL  Final  Culture, blood (Routine X 2) w Reflex to ID Panel     Status: None   Collection Time: 05/20/16  3:25 PM  Result Value Ref Range Status   Specimen Description BLOOD RIGHT HAND  Final   Special Requests IN PEDIATRIC BOTTLE 3CC  Final   Culture NO GROWTH 5 DAYS  Final   Report Status 05/25/2016 FINAL  Final  MRSA PCR Screening     Status: None   Collection Time: 05/20/16  8:44 PM  Result Value Ref Range Status   MRSA by PCR NEGATIVE NEGATIVE Final    Comment:        The GeneXpert MRSA Assay (FDA approved for NASAL specimens only), is one component of a comprehensive MRSA colonization surveillance program. It is not intended to diagnose MRSA infection nor to guide or monitor treatment for MRSA infections.      Labs: CBC:  Recent Labs Lab 05/22/16 0802 05/23/16 0603 05/24/16 0234 05/25/16 0249 05/26/16 0143  WBC 14.8* 13.0* 13.2* 12.1* 14.3*  HGB 11.4* 11.1* 11.6* 11.2* 11.7*  HCT 34.6* 33.6* 34.8* 33.8* 35.0*  MCV 89.9 88.7 88.8 88.7 88.8  PLT 283 358 392 397 [id number]*   Basic Metabolic Panel:  Recent Labs Lab 05/21/16 2333 05/23/16 0055 05/25/16 0249 05/26/16 0143  NA 139 135 137 137  K 3.7 3.6 3.1* 3.3*  CL 105 105 105 105  CO2 26 21* 23 23  GLUCOSE 96 74 109* 99  BUN 7 9 8 7   CREATININE 0.83 0.68 0.77 0.75  CALCIUM 8.4* 8.2* 8.5* 8.8*  MG  --   --   --  1.8   Liver Function Tests: No results for input(s): AST, ALT, ALKPHOS, BILITOT, PROT, ALBUMIN in the last 168 hours. No results for input(s): LIPASE, AMYLASE in the last 168 hours. No results for input(s): AMMONIA in the last 168 hours. Cardiac Enzymes: No results for input(s): CKTOTAL, CKMB, CKMBINDEX,  TROPONINI in the last 168 hours. BNP (last 3 results)  Recent Labs  05/20/16 1450  BNP 104.7*   CBG: No results for input(s): GLUCAP in the last 168 hours. Time spent: 30 minutes  Signed:  Berle Mull  [facility] 05/26/2016 , 7:27 AM

## 2017-12-28 ENCOUNTER — Encounter (HOSPITAL_COMMUNITY): Payer: Self-pay | Admitting: Emergency Medicine

## 2017-12-28 ENCOUNTER — Emergency Department (HOSPITAL_COMMUNITY): Payer: BLUE CROSS/BLUE SHIELD

## 2017-12-28 ENCOUNTER — Other Ambulatory Visit: Payer: Self-pay

## 2017-12-28 ENCOUNTER — Emergency Department (HOSPITAL_COMMUNITY)
Admission: EM | Admit: 2017-12-28 | Discharge: 2017-12-28 | Discharge status: Against Medical Advice | Payer: BLUE CROSS/BLUE SHIELD | Attending: Emergency Medicine | Admitting: Emergency Medicine

## 2017-12-28 DIAGNOSIS — F17210 Nicotine dependence, cigarettes, uncomplicated: Secondary | ICD-10-CM | POA: Diagnosis not present

## 2017-12-28 DIAGNOSIS — R079 Chest pain, unspecified: Secondary | ICD-10-CM | POA: Insufficient documentation

## 2017-12-28 LAB — BASIC METABOLIC PANEL
ANION GAP: 11 (ref 5–15)
BUN: 13 mg/dL (ref 6–20)
CALCIUM: 9.2 mg/dL (ref 8.9–10.3)
CO2: 22 mmol/L (ref 22–32)
Chloride: 108 mmol/L (ref 101–111)
Creatinine, Ser: 0.77 mg/dL (ref 0.61–1.24)
Glucose, Bld: 93 mg/dL (ref 65–99)
POTASSIUM: 3.5 mmol/L (ref 3.5–5.1)
Sodium: 141 mmol/L (ref 135–145)

## 2017-12-28 LAB — CBC
HEMATOCRIT: 45.8 % (ref 39.0–52.0)
HEMOGLOBIN: 15 g/dL (ref 13.0–17.0)
MCH: 31.6 pg (ref 26.0–34.0)
MCHC: 32.8 g/dL (ref 30.0–36.0)
MCV: 96.4 fL (ref 78.0–100.0)
Platelets: 261 10*3/uL (ref 150–400)
RBC: 4.75 MIL/uL (ref 4.22–5.81)
RDW: 13.3 % (ref 11.5–15.5)
WBC: 11.4 10*3/uL — ABNORMAL HIGH (ref 4.0–10.5)

## 2017-12-28 LAB — I-STAT TROPONIN, ED: TROPONIN I, POC: 0 ng/mL (ref 0.00–0.08)

## 2017-12-28 NOTE — ED Notes (Signed)
Pt refusing IV for CT angio, PA made aware.

## 2017-12-28 NOTE — Discharge Instructions (Addendum)
You were seen in the emergency department today for chest pain and left prior to your full evaluation.  Your EKG and the enzyme used to look at your heart did not show an acute heart attack.  Your labs were all fairly normal.  We would like you to follow-up with your primary care provider tomorrow.  Please return to the emergency department at anytime for continued evaluation, please return immediately for worsening pain, worsening trouble breathing, passing out, or any other concerns.

## 2017-12-28 NOTE — ED Provider Notes (Signed)
[facility] EMERGENCY DEPARTMENT Provider Note   CSN: [id number] Arrival date & time: 12/28/17  1329     History   Chief Complaint Chief Complaint  Patient presents with  . Chest Pain    HPI Carl Bush is a 47 y.o. male with a hx of atrial flutter, squamous cell carcinoma, EtOH dependence, tobacco abuse, pulmonary embolism (no longer on anticoagulation), dilated cardiomyopathy, and previous pericardial effusion who presents to the ED with complaints of chest pain x 2.5 weeks.  Patient reports pain is the left side of chest, nonradiating, sharp in nature.  Chest pain was initially intermittent with coughing and with sneezing but has become constant.  Current pain is a 6 out of 10 in severity, worse with coughing and with a deep breath.  No specific alleviating factors.  Patient has felt somewhat short of breath with exertion at times, but the exertion does not seem to worsen his pain.  He states that this feels somewhat similar to previous pulmonary embolism, but somewhat different. Denies leg pain/swelling, hemoptysis, recent surgery/trauma, recent long travel, or hormone use.  Denies fever chills, congestion, nausea, vomiting, diaphoresis, or syncopal episodes.  HPI  Past Medical History:  Diagnosis Date  . Alcohol dependence ([place])   . Atrial flutter ([place]) 05/21/2016  . 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 Tobacco dependence     Patient Active Problem List   Diagnosis Date Noted  . DCM (dilated cardiomyopathy) ([place])   . Pericardial effusion   . Sepsis ([place]) 05/21/2016  . Tobacco use disorder 05/21/2016  . Alcohol dependence ([place]) 05/21/2016  . Atrial flutter ([place]) 05/21/2016  . Tachycardia 05/21/2016  . Effusion, pericardium 05/21/2016  . Pulmonary embolism ([place]) 05/20/2016    Past Surgical History:  Procedure Laterality Date  . CARDIOVERSION N/A 05/26/2016   Procedure:  CARDIOVERSION;  Surgeon: Sanda Klein, MD;  Location: [facility];  Service: Cardiovascular;  Laterality: N/A;  . SKIN BIOPSY  11/2015  . TEE WITHOUT CARDIOVERSION N/A 05/26/2016   Procedure: TRANSESOPHAGEAL ECHOCARDIOGRAM (TEE);  Surgeon: Sanda Klein, MD;  Location: [facility] ENDOSCOPY;  Service: Cardiovascular;  Laterality: N/A;        Home Medications    Prior to Admission medications   Medication Sig Start Date End Date Taking? Authorizing Provider  amiodarone (PACERONE) 200 MG tablet Take 400 mg twice a day for 1 week followed by 200 mg twice a day. Patient not taking: Reported on 12/28/2017 05/26/16   Lavina Hamman, MD  apixaban (ELIQUIS) 5 MG TABS tablet Take 10 mg twice a day till 06/02/16, take 5 mg twice a day after that. Patient not taking: Reported on 12/28/2017 05/26/16   Lavina Hamman, MD  colchicine 0.6 MG tablet Take 1 tablet (0.6 mg total) by mouth 2 (two) times daily. Patient not taking: Reported on 12/28/2017 05/26/16   Lavina Hamman, MD  Metoprolol Tartrate (LOPRESSOR) 25 MG tablet Take 1 tablet (25 mg total) by mouth 2 (two) times daily. Patient not taking: Reported on 12/28/2017 05/26/16   Lavina Hamman, MD  nicotine (NICODERM CQ - DOSED IN MG/24 HOURS) 21 mg/24hr patch Place 1 patch (21 mg total) onto the skin daily. Patient not taking: Reported on 12/28/2017 05/27/16   Lavina Hamman, MD  polyethylene glycol [facility] Floria Raveling) packet Take 17 g by mouth daily. Patient not taking: Reported on 12/28/2017 05/27/16   Lavina Hamman, MD  senna-docusate (SENOKOT-S) 8.6-50 MG tablet Take 1 tablet by mouth at bedtime. Patient not taking: Reported on 12/28/2017 05/26/16   Lavina Hamman, MD    Family History Family History  Problem Relation Age of Onset  . Healthy Mother   . Healthy Sister   . Healthy Brother   . Heart attack Maternal Grandfather 65    Social History Social History   Tobacco Use  . Smoking status: Current Every Day Smoker    Packs/day:  1.00    Years: 24.00    Pack years: 24.00  . Smokeless tobacco: Never Used  Substance Use Topics  . Alcohol use: Yes    Alcohol/week: 25.2 oz    Types: 42 Cans of beer per week    Comment: 12 pack every other day  . Drug use: Yes    Types: Marijuana    Comment: daily marijuana     Allergies   Patient has no known allergies.   Review of Systems Review of Systems  Constitutional: Negative for chills, diaphoresis and fever.  Respiratory: Positive for shortness of breath.   Cardiovascular: Positive for chest pain. Negative for palpitations and leg swelling.  Gastrointestinal: Negative for abdominal pain, diarrhea, nausea and vomiting.  Neurological: Negative for syncope, weakness and numbness.  All other systems reviewed and are negative.    Physical Exam Updated Vital Signs BP (!) 131/95 (BP Location: Right Arm)   Pulse 67   Temp 98.6 F (37 C) (Oral)   Resp 16   Ht 6\' 2"  (1.88 m)   Wt 77.1 kg (170 lb)   SpO2 100%   BMI 21.83 kg/m   Physical Exam  Constitutional: He appears well-developed and well-nourished. No distress.  HENT:  Head: Normocephalic and atraumatic.  Eyes: Conjunctivae are normal. Right eye exhibits no discharge. Left eye exhibits no discharge.  Cardiovascular: Normal rate and regular rhythm. Exam reveals no friction rub.  No murmur heard. Pulses:      Radial pulses are 2+ on the right side, and 2+ on the left side.       Posterior tibial pulses are 2+ on the right side, and 2+ on the left side.  Pulmonary/Chest: Effort normal and breath sounds normal. No respiratory distress. He has no decreased breath sounds. He has no wheezes. He has no rales. He exhibits tenderness (Left anterior chest wall). He exhibits no mass, no crepitus, no edema, no deformity, no swelling and no retraction.  Abdominal: Soft. He exhibits no distension. There is no tenderness.  Musculoskeletal:       Right lower leg: He exhibits no tenderness and no edema.       Left lower  leg: He exhibits no tenderness and no edema.  Neurological: He is alert.  Clear speech.   Skin: Skin is warm and dry. No rash noted.  Psychiatric: He has a normal mood and affect. His behavior is normal.  Nursing note and vitals reviewed.   ED Treatments / Results  Labs Results for orders placed or performed during the hospital encounter of 16/10/96  Basic metabolic panel  Result Value Ref Range   Sodium 141 135 - 145 mmol/L   Potassium 3.5 3.5 - 5.1 mmol/L   Chloride 108 101 - 111 mmol/L   CO2 22 22 - 32 mmol/L   Glucose, Bld 93 65 - 99 mg/dL   BUN 13 6 - 20 mg/dL   Creatinine, Ser 0.77 0.61 - 1.24 mg/dL   Calcium 9.2 8.9 - 10.3 mg/dL  GFR calc non Af Amer >60 >60 mL/min   GFR calc Af Amer >60 >60 mL/min   Anion gap 11 5 - 15  CBC  Result Value Ref Range   WBC 11.4 (H) 4.0 - 10.5 K/uL   RBC 4.75 4.22 - 5.81 MIL/uL   Hemoglobin 15.0 13.0 - 17.0 g/dL   HCT 45.8 39.0 - 52.0 %   MCV 96.4 78.0 - 100.0 fL   MCH 31.6 26.0 - 34.0 pg   MCHC 32.8 30.0 - 36.0 g/dL   RDW 13.3 11.5 - 15.5 %   Platelets 261 150 - 400 K/uL  I-stat troponin, ED  Result Value Ref Range   Troponin i, poc 0.00 0.00 - 0.08 ng/mL   Comment 3            EKG EKG Interpretation  Date/Time:  Monday December 28 2017 13:38:14 EDT Ventricular Rate:  73 PR Interval:  162 QRS Duration: 114 QT Interval:  398 QTC Calculation: 438 R Axis:   -106 Text Interpretation:  Normal sinus rhythm with sinus arrhythmia Pulmonary disease pattern Right bundle branch block Abnormal ECG Confirmed by Virgel Manifold [phone]) on 12/28/2017 10:38:07 PM   Radiology Dg Chest 2 View  Result Date: 12/28/2017 CLINICAL DATA:  Two weeks of left-sided chest pain and exertional shortness of breath especially with stairs. Long-term smoker. History of atrial flutter, pulmonary embolism. EXAM: CHEST - 2 VIEW COMPARISON:  Chest x-ray and chest CT scan of May 20, 2016 FINDINGS: The lungs remain hyperinflated. There is no focal infiltrate.  There is no pleural effusion. The heart and pulmonary vascularity are normal. The mediastinum is normal in width. The trachea is midline. The bony thorax exhibits no acute abnormality. IMPRESSION: COPD-smoking related changes. No pneumonia, CHF, nor other acute cardiopulmonary abnormality. Electronically Signed   By: David  Martinique M.D.   On: 12/28/2017 14:31    Procedures Procedures (including critical care time)  Medications Ordered in ED Medications - No data to display   Previous results reviewed:   TEE 05/26/2016:  Study Conclusions  - Left ventricle: The cavity size was normal. Wall thickness was   normal. Systolic function was normal. The estimated ejection   fraction was in the range of 55% to 60%. Wall motion was normal;   there were no regional wall motion abnormalities. No evidence of   thrombus. - Left atrium: No evidence of thrombus in the atrial cavity or   appendage. No evidence of thrombus in the atrial cavity or   appendage. No spontaneous echo contrast was observed. - Pericardium, extracardiac: There was a small left pleural   effusion.  Impressions:  - Successful cardioversion. No cardiac source of emboli was   indentified.   Initial Impression / Assessment and Plan / ED Course  I have reviewed the triage vital signs and the nursing notes.  Pertinent labs & imaging results that were available during my care of the patient were reviewed by me and considered in my medical decision making (see chart for details).   Patient presents to the emergency department with complaints of left-sided chest pain.  Patient nontoxic-appearing, no apparent distress, vitals WNL with exception of elevated blood pressure.  DDX: ACS, pulmonary embolism, dissection, pneumonia, pleural effusion, pneumothorax, muscle strain/spasm.  Work-up per triage reviewed, fairly unremarkable.  Patient has nonspecific leukocytosis at 11.4.  No anemia.  No significant electrolyte abnormalities.   Chest x-ray reveals COPD related changes, no pneumothorax, pleural effusion, evidence of pneumonia.  EKG without obvious ischemia, troponin  negative.  Given patient's history of pulmonary embolism not on anticoagulation this remains high on the differential, will obtain CTA of the chest.   17:30: RN informed me patient does not wish to stay for CT scan   17:40: The patient and I discussed the nature and purpose, risks and benefits, as well as, the alternatives of treatment. Time was given to allow the opportunity to ask questions and consider options. After the discussion, the patient decided to refuse the offerred treatment. The patient was informed that refusal could lead to, but was not limited to, death, permanent disability, or severe pain. If present, I asked the relatives and/or significant others to dissuade the patient without success. Prior to refusing, I determined that the patient had the capacity to make their decision and understood the consequences of that decision. After refusal, I made every reasonable effort to treat them to the best of my ability.  The patient was notified that they may return to the emergency department at any time for further treatment.  Patient signed out AMA.    Final Clinical Impressions(s) / ED Diagnoses   Final diagnoses:  Chest pain, unspecified type    ED Discharge Orders    None       Leafy Kindle 12/28/17 2241    Virgel Manifold, MD 12/30/17 1245

## 2017-12-28 NOTE — ED Provider Notes (Cosign Needed)
Patient placed in Quick Look pathway, seen and evaluated   Chief Complaint: chest pain  HPI:  Carl Bush is a 47 y.o. male with hx of PE x2, no on any blood thinners now. Patient presents to the ED with left side chest pain that started 2 weeks ago and has gotten worse.patient reports shortness of breath. The pain does not radiate. The pain increases with movement, cough and deep breath.   ROS: CV: chest pain  Resp: shortness of breath  Physical Exam:  BP (!) 131/95 (BP Location: Right Arm)   Pulse 67   Temp 98.6 F (37 C) (Oral)   Resp 16   SpO2 100%    Gen: No distress  Neuro: Awake and Alert  Skin: Warm and dry  Heart: regular rate and rhythm  Lungs: without rales or wheezing.  Chest: tender on palpation of left chest wall.    Initiation of care has begun. The patient has been counseled on the process, plan, and necessity for staying for the completion/evaluation, and the remainder of the medical screening examination    Ashley Murrain, NP 12/28/17 1345

## 2017-12-28 NOTE — ED Triage Notes (Signed)
Pt arrives via POV from home with 2 week hx of sharp left sided chest pain. Pain is worse with movement, coughing. Denies hx of MI. Denies radiation pain, recent fever. Pt with some exertional SOB. Hx of PE and a flutter.

## 2017-12-28 NOTE — ED Notes (Signed)
Pt refusing to be placed on monitors. Wants to see MD and go home.

## 2019-05-29 IMAGING — DX DG CHEST 2V
2 series · 2 of 2 positions shown · non-contrast
Comparison: Chest x-ray and chest CT scan May 20, 2016

CLINICAL DATA: Two weeks of left-sided chest pain and exertional
shortness of breath especially with stairs. Long-term smoker.
History of atrial flutter, pulmonary embolism.

EXAM:
CHEST - 2 VIEW

[chest pa]
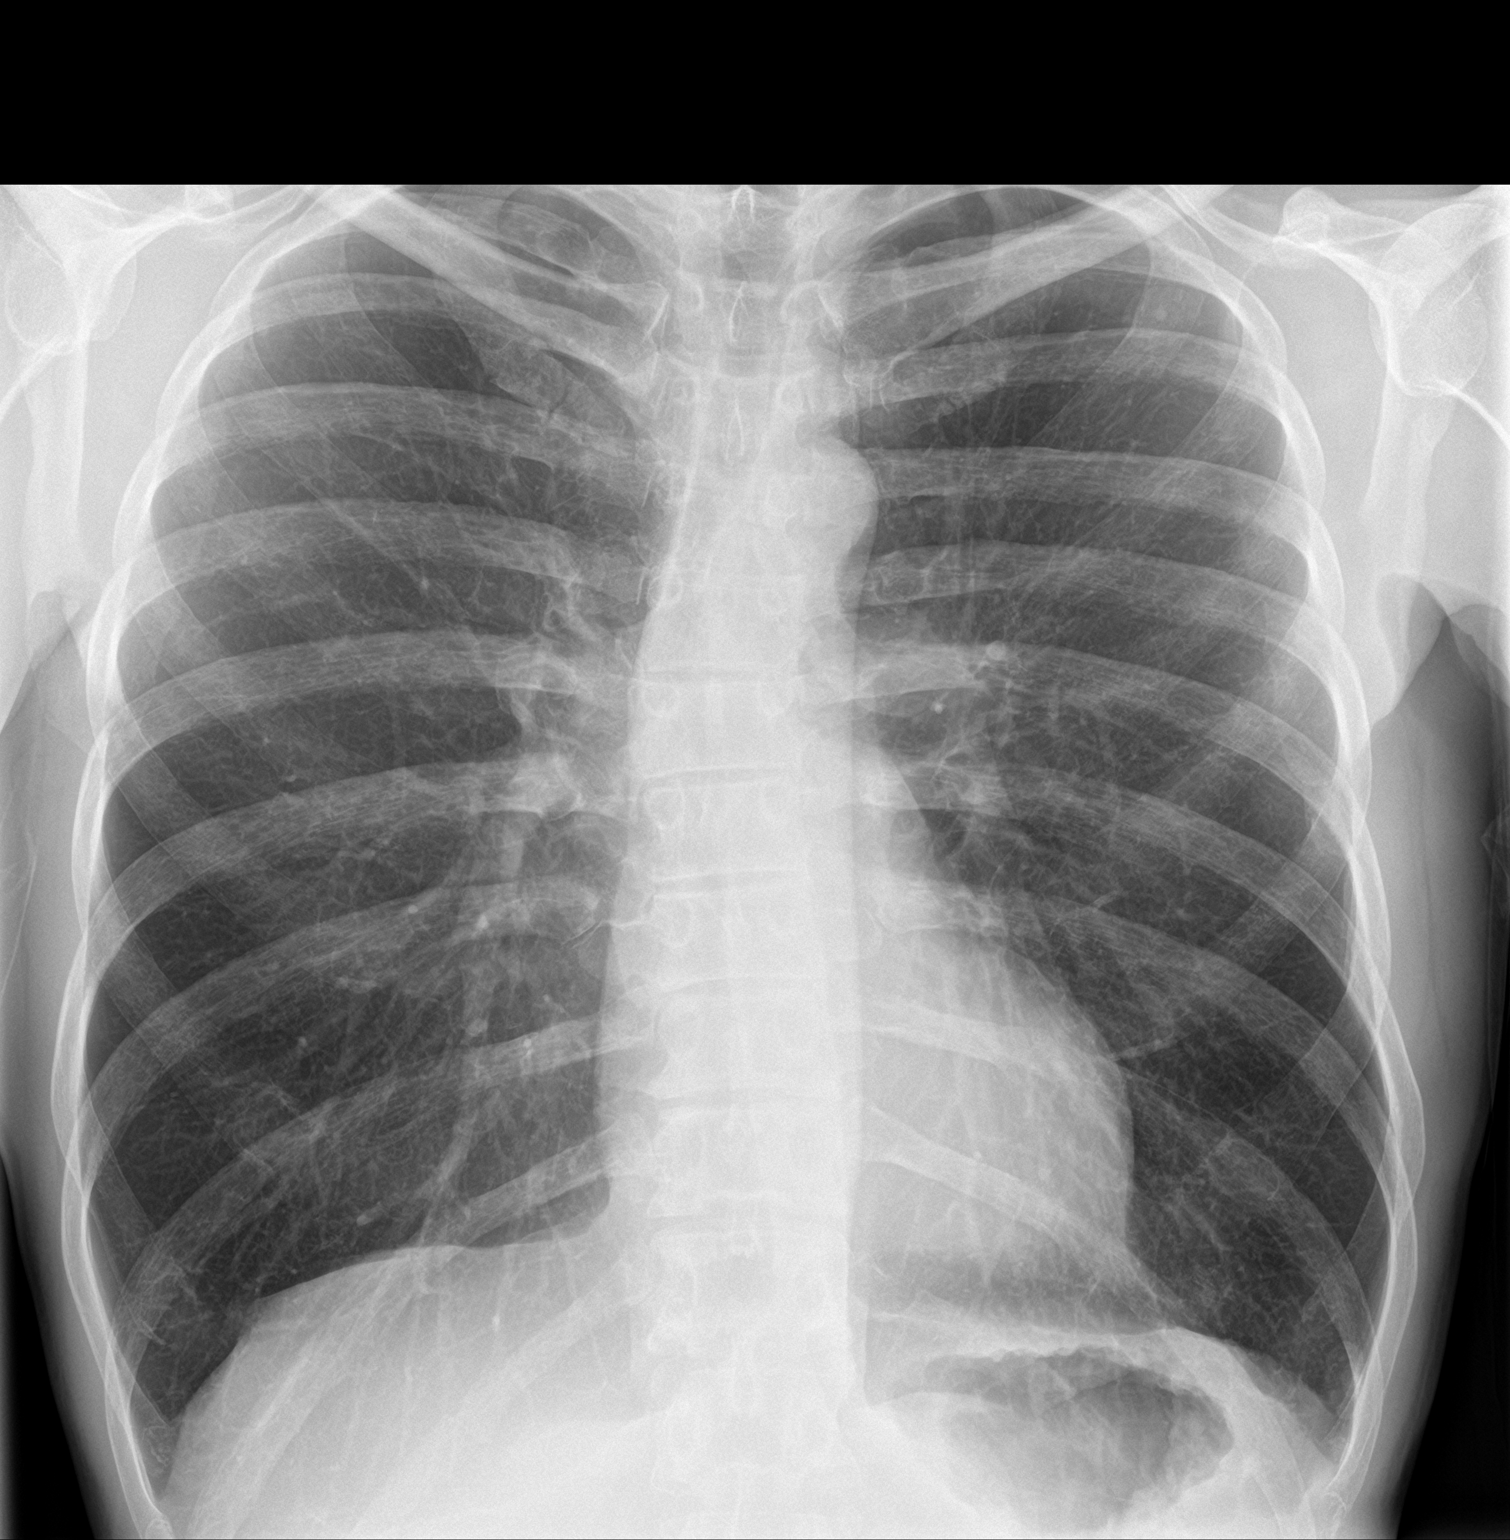

[chest lat]
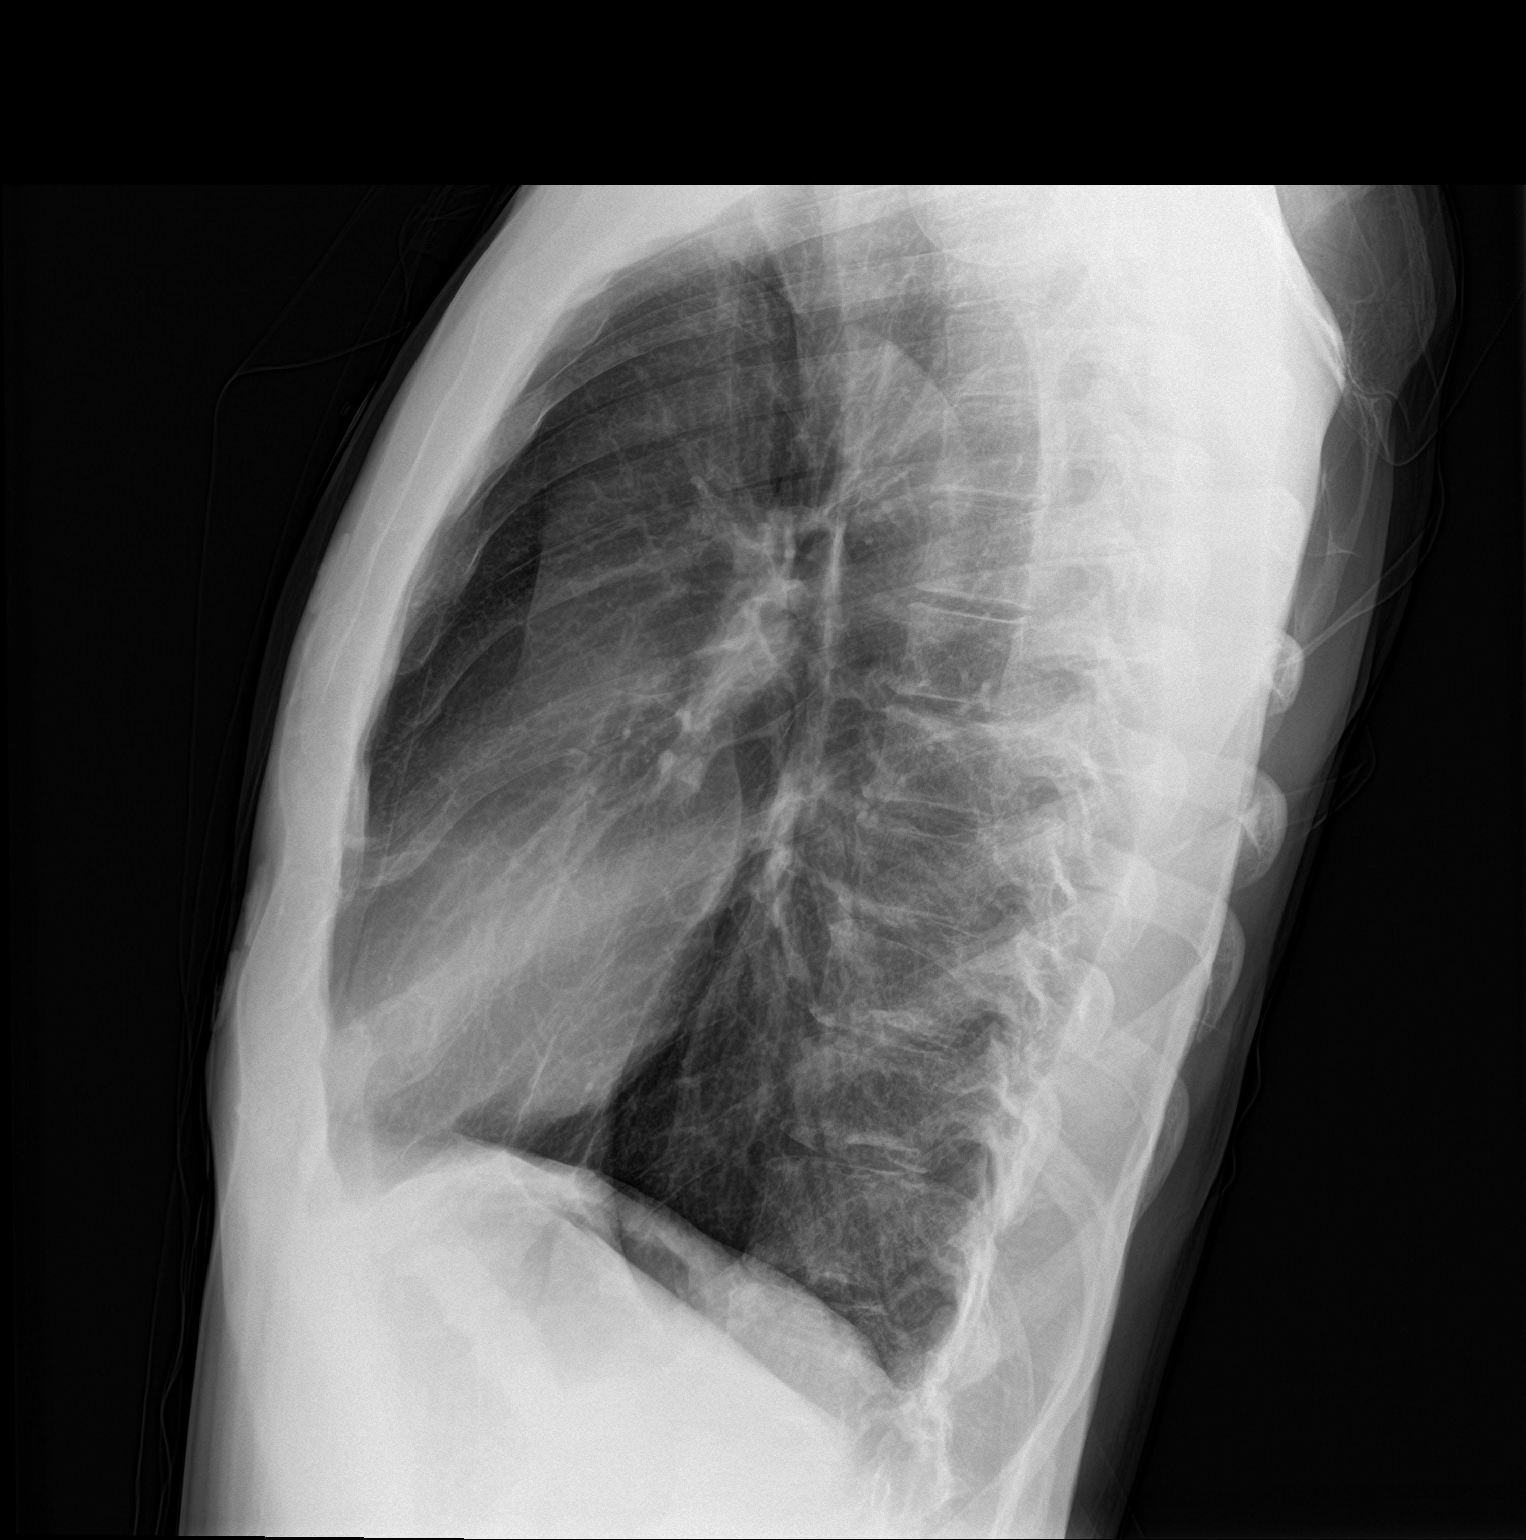

[2 of 2 positions shown; findings below may reference images not displayed]

FINDINGS: The lungs remain hyperinflated. There is no focal infiltrate. There
is no pleural effusion. The heart and pulmonary vascularity are
normal. The mediastinum is normal in width. The trachea is midline.
The bony thorax exhibits no acute abnormality.
IMPRESSION: COPD-smoking related changes. No pneumonia, CHF, nor other acute
cardiopulmonary abnormality.

## 2022-06-10 DIAGNOSIS — B078 Other viral warts: Secondary | ICD-10-CM | POA: Diagnosis not present

## 2022-06-10 DIAGNOSIS — D485 Neoplasm of uncertain behavior of skin: Secondary | ICD-10-CM | POA: Diagnosis not present

## 2022-09-13 DIAGNOSIS — M47817 Spondylosis without myelopathy or radiculopathy, lumbosacral region: Secondary | ICD-10-CM | POA: Diagnosis not present

## 2022-09-13 DIAGNOSIS — X500XXA Overexertion from strenuous movement or load, initial encounter: Secondary | ICD-10-CM | POA: Diagnosis not present

## 2022-09-13 DIAGNOSIS — F17210 Nicotine dependence, cigarettes, uncomplicated: Secondary | ICD-10-CM | POA: Diagnosis not present

## 2022-09-13 DIAGNOSIS — M545 Low back pain, unspecified: Secondary | ICD-10-CM | POA: Diagnosis not present

## 2022-09-13 DIAGNOSIS — S39012A Strain of muscle, fascia and tendon of lower back, initial encounter: Secondary | ICD-10-CM | POA: Diagnosis not present

## 2022-10-10 DIAGNOSIS — J180 Bronchopneumonia, unspecified organism: Secondary | ICD-10-CM | POA: Diagnosis not present

## 2022-10-10 DIAGNOSIS — Z8679 Personal history of other diseases of the circulatory system: Secondary | ICD-10-CM | POA: Diagnosis not present

## 2022-10-10 DIAGNOSIS — I2510 Atherosclerotic heart disease of native coronary artery without angina pectoris: Secondary | ICD-10-CM | POA: Diagnosis not present

## 2022-10-10 DIAGNOSIS — J984 Other disorders of lung: Secondary | ICD-10-CM | POA: Diagnosis not present

## 2022-10-10 DIAGNOSIS — E119 Type 2 diabetes mellitus without complications: Secondary | ICD-10-CM | POA: Diagnosis not present

## 2022-10-10 DIAGNOSIS — J439 Emphysema, unspecified: Secondary | ICD-10-CM | POA: Diagnosis not present

## 2022-10-10 DIAGNOSIS — F17210 Nicotine dependence, cigarettes, uncomplicated: Secondary | ICD-10-CM | POA: Diagnosis not present

## 2022-10-10 DIAGNOSIS — R9431 Abnormal electrocardiogram [ECG] [EKG]: Secondary | ICD-10-CM | POA: Diagnosis not present

## 2022-10-10 DIAGNOSIS — I4510 Unspecified right bundle-branch block: Secondary | ICD-10-CM | POA: Diagnosis not present

## 2022-10-10 DIAGNOSIS — R059 Cough, unspecified: Secondary | ICD-10-CM | POA: Diagnosis not present

## 2022-10-10 DIAGNOSIS — R0602 Shortness of breath: Secondary | ICD-10-CM | POA: Diagnosis not present

## 2022-10-13 DIAGNOSIS — I2510 Atherosclerotic heart disease of native coronary artery without angina pectoris: Secondary | ICD-10-CM | POA: Diagnosis not present

## 2022-10-13 DIAGNOSIS — J439 Emphysema, unspecified: Secondary | ICD-10-CM | POA: Diagnosis not present

## 2022-10-28 ENCOUNTER — Ambulatory Visit: Payer: BLUE CROSS/BLUE SHIELD | Admitting: Family Medicine

## 2023-03-24 DIAGNOSIS — L578 Other skin changes due to chronic exposure to nonionizing radiation: Secondary | ICD-10-CM | POA: Diagnosis not present

## 2023-03-24 DIAGNOSIS — L821 Other seborrheic keratosis: Secondary | ICD-10-CM | POA: Diagnosis not present

## 2023-03-24 DIAGNOSIS — D225 Melanocytic nevi of trunk: Secondary | ICD-10-CM | POA: Diagnosis not present

## 2023-03-24 DIAGNOSIS — Z859 Personal history of malignant neoplasm, unspecified: Secondary | ICD-10-CM | POA: Diagnosis not present

## 2023-03-24 DIAGNOSIS — L570 Actinic keratosis: Secondary | ICD-10-CM | POA: Diagnosis not present

## 2023-03-24 DIAGNOSIS — Z85828 Personal history of other malignant neoplasm of skin: Secondary | ICD-10-CM | POA: Diagnosis not present

## 2023-03-24 DIAGNOSIS — L814 Other melanin hyperpigmentation: Secondary | ICD-10-CM | POA: Diagnosis not present

## 2023-06-10 DIAGNOSIS — S81812A Laceration without foreign body, left lower leg, initial encounter: Secondary | ICD-10-CM | POA: Diagnosis not present

## 2023-06-10 DIAGNOSIS — T148XXD Other injury of unspecified body region, subsequent encounter: Secondary | ICD-10-CM | POA: Diagnosis not present

## 2023-06-18 DIAGNOSIS — T148XXD Other injury of unspecified body region, subsequent encounter: Secondary | ICD-10-CM | POA: Diagnosis not present

## 2023-06-18 DIAGNOSIS — S81812D Laceration without foreign body, left lower leg, subsequent encounter: Secondary | ICD-10-CM | POA: Diagnosis not present

## 2023-10-16 ENCOUNTER — Ambulatory Visit: Admitting: Student in an Organized Health Care Education/Training Program

## 2023-10-16 ENCOUNTER — Ambulatory Visit (HOSPITAL_BASED_OUTPATIENT_CLINIC_OR_DEPARTMENT_OTHER)
Admission: RE | Admit: 2023-10-16 | Discharge: 2023-10-16 | Disposition: A | Discharge status: Home / Self Care | Source: Ambulatory Visit | Attending: Student in an Organized Health Care Education/Training Program | Admitting: Student in an Organized Health Care Education/Training Program

## 2023-10-16 ENCOUNTER — Other Ambulatory Visit (HOSPITAL_BASED_OUTPATIENT_CLINIC_OR_DEPARTMENT_OTHER)

## 2023-10-16 ENCOUNTER — Encounter: Payer: Self-pay | Admitting: Student in an Organized Health Care Education/Training Program

## 2023-10-16 VITALS — BP 130/88 | HR 83 | Temp 97.8°F | Ht 73.0 in | Wt 161.0 lb

## 2023-10-16 DIAGNOSIS — I10 Essential (primary) hypertension: Secondary | ICD-10-CM | POA: Diagnosis not present

## 2023-10-16 DIAGNOSIS — Z1159 Encounter for screening for other viral diseases: Secondary | ICD-10-CM | POA: Diagnosis not present

## 2023-10-16 DIAGNOSIS — K1370 Unspecified lesions of oral mucosa: Secondary | ICD-10-CM | POA: Insufficient documentation

## 2023-10-16 DIAGNOSIS — F17200 Nicotine dependence, unspecified, uncomplicated: Secondary | ICD-10-CM | POA: Diagnosis not present

## 2023-10-16 DIAGNOSIS — J189 Pneumonia, unspecified organism: Secondary | ICD-10-CM | POA: Insufficient documentation

## 2023-10-16 LAB — CBC WITH DIFFERENTIAL/PLATELET
Basophils Absolute: 0.1 10*3/uL (ref 0.0–0.1)
Basophils Relative: 0.6 % (ref 0.0–3.0)
Eosinophils Absolute: 0.4 10*3/uL (ref 0.0–0.7)
Eosinophils Relative: 3.3 % (ref 0.0–5.0)
HCT: 45.2 % (ref 39.0–52.0)
Hemoglobin: 15 g/dL (ref 13.0–17.0)
Lymphocytes Relative: 14.1 % (ref 12.0–46.0)
Lymphs Abs: 1.8 10*3/uL (ref 0.7–4.0)
MCHC: 33.1 g/dL (ref 30.0–36.0)
MCV: 100 fl (ref 78.0–100.0)
Monocytes Absolute: 1.3 10*3/uL — ABNORMAL HIGH (ref 0.1–1.0)
Monocytes Relative: 10.3 % (ref 3.0–12.0)
Neutro Abs: 9.3 10*3/uL — ABNORMAL HIGH (ref 1.4–7.7)
Neutrophils Relative %: 71.7 % (ref 43.0–77.0)
Platelets: 403 10*3/uL — ABNORMAL HIGH (ref 150.0–400.0)
RBC: 4.52 Mil/uL (ref 4.22–5.81)
RDW: 14.2 % (ref 11.5–15.5)
WBC: 12.9 10*3/uL — ABNORMAL HIGH (ref 4.0–10.5)

## 2023-10-16 LAB — LIPID PANEL
Cholesterol: 148 mg/dL (ref 0–200)
HDL: 85.2 mg/dL (ref >39.00)
LDL Cholesterol: 45 mg/dL (ref 0–99)
NonHDL: 63.01
Total CHOL/HDL Ratio: 2
Triglycerides: 90 mg/dL (ref 0.0–149.0)
VLDL: 18 mg/dL (ref 0.0–40.0)

## 2023-10-16 LAB — BASIC METABOLIC PANEL WITH GFR
BUN: 15 mg/dL (ref 6–23)
CO2: 30 meq/L (ref 19–32)
Calcium: 9.6 mg/dL (ref 8.4–10.5)
Chloride: 100 meq/L (ref 96–112)
Creatinine, Ser: 0.86 mg/dL (ref 0.40–1.50)
GFR: 100.59 mL/min (ref >60.00)
Glucose, Bld: 100 mg/dL — ABNORMAL HIGH (ref 70–99)
Potassium: 4.7 meq/L (ref 3.5–5.1)
Sodium: 140 meq/L (ref 135–145)

## 2023-10-16 LAB — HEMOGLOBIN A1C: Hgb A1c MFr Bld: 5.4 % (ref 4.6–6.5)

## 2023-10-16 NOTE — Assessment & Plan Note (Signed)
 Clinical suspicion for community-acquired pneumonia based on symptoms of chest discomfort, pleuritic pain, and productive cough.  No diagnosis of underlying lung disease, but at risk for COPD given tobacco use.  Will check CBC today, as well as x-ray.

## 2023-10-16 NOTE — Assessment & Plan Note (Signed)
 Mildly elevated blood pressure, stage I hypertension.  Treatment optional at this point.  Manage check lipids and A1c for risk factor modifications.  If he is at elevated risk for ischemic events, will treat this mild hypertension.

## 2023-10-16 NOTE — Assessment & Plan Note (Addendum)
 In the soft palate of the mouth there is a incidental finding of a 8 x 5 mm erythematous lesion, appears flat.  No ulcerations.  Not associated with lymphadenopathy.  Patient is at increased risk of head neck malignancy due to tobacco and alcohol use.  Lesion could also be from local trauma.  Asked him to return in 1 month for recheck.  If it does not heal/resolve, will refer to ENT.

## 2023-10-16 NOTE — Progress Notes (Signed)
 New Patient Office Visit  Subjective    Patient ID: Carl Bush, male    DOB: Dec 20, 1970  Age: 53 y.o. MRN: [id number]  CC:   Chief Complaint  Patient presents with   Establish Care    Patient states he had bronchial pneumonia last April and starting to heave the same symptoms as last time. Symptoms started Thursday of last week, burning in chest and sever cough. No OTC medication but did take some albuterol that was left over from last year.     HPI  Carl Bush presents to establish care  53 year old person with a history of tobacco use disorder here to establish care.  Originally from [place], moved to [place] around 2017 to be closer to his aging mother.  Previous physician was at [facility], but he had a change in insurance that necessitated a change in clinics.  He reports a significant illness around 2019 where he had a pulmonary embolism, complicated by atrial flutter.  He was treated with anticoagulation for 1 year and recovered well.  He reports his atrial flutter was managed with an ablation procedure.  He reports being in good health last few years, does not take any prescribed medications.  He reports last April he was diagnosed with a bronchopneumonia on CT scan at an emergency department.  Treated with antibiotics.  He is having some symptoms now that he thinks are similar to that episode 1 year ago.  He started to notice a burning sensation in his right lower ribs, some pleuritic discomfort, increased cough, and increased sputum production.  Not having fevers but is having generalized bodyaches.  No recent viral illness, no sinus congestion, no pharyngitis.  Reports mild increased dyspnea with exertion, but still very functional.    Outpatient Encounter Medications as of 10/16/2023  Medication Sig   [DISCONTINUED] albuterol (VENTOLIN HFA) 108 (90 Base) MCG/ACT inhaler Inhale into the lungs.   No facility-administered encounter medications on file as of  10/16/2023.    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, rabid racoon, underwent rabies treatment    Squamous cell skin cancer 11/2015   R forearm   Tobacco dependence     Past Surgical History:  Procedure Laterality Date   CARDIOVERSION N/A 05/26/2016   Procedure: CARDIOVERSION;  Surgeon: Thurmon Fair, MD;  Location: [facility] ENDOSCOPY;  Service: Cardiovascular;  Laterality: N/A;   SKIN BIOPSY  11/2015   TEE WITHOUT CARDIOVERSION N/A 05/26/2016   Procedure: TRANSESOPHAGEAL ECHOCARDIOGRAM (TEE);  Surgeon: Thurmon Fair, MD;  Location: [facility] ENDOSCOPY;  Service: Cardiovascular;  Laterality: N/A;    Family History  Problem Relation Age of Onset   Healthy Mother    Healthy Sister    Healthy Brother    Heart attack Maternal Grandfather 76    Social History   Socioeconomic History   Marital status: Single    Spouse name: Not on file   Number of children: Not on file   Years of education: Not on file   Highest education level: Not on file  Occupational History   Occupation: landlord  Tobacco Use   Smoking status: Every Day    Current packs/day: 1.00    Average packs/day: 1 pack/day for 24.0 years (24.0 ttl pk-yrs)    Types: Cigarettes   Smokeless tobacco: Never  Substance and Sexual Activity   Alcohol use: Yes    Alcohol/week: 42.0 standard drinks of alcohol  Types: 42 Cans of beer per week    Comment: 12 pack every other day   Drug use: Yes    Types: Marijuana    Comment: daily marijuana   Sexual activity: Not on file  Other Topics Concern   Not on file  Social History Narrative   Not on file   Social Drivers of Health   Financial Resource Strain: Not on file  Food Insecurity: Not on file  Transportation Needs: Not on file  Physical Activity: Not on file  Stress: Not on file  Social Connections: Unknown (11/13/2021)   [facility]   Social Network     Social Network: Not on file  Intimate Partner Violence: Not At Risk (10/10/2022)   Received from [facility]   HITS    Over the last 12 months how often did your partner physically hurt you?: Never    Over the last 12 months how often did your partner insult you or talk down to you?: Never    Over the last 12 months how often did your partner threaten you with physical harm?: Never    Over the last 12 months how often did your partner scream or curse at you?: Never        Objective    BP 130/88   Pulse 83   Temp 97.8 F (36.6 C) (Temporal)   Ht 6\' 1"  (1.854 m)   Wt 161 lb (73 kg)   SpO2 97%   BMI 21.24 kg/m   Physical Exam  Gen: Well-appearing man Eyes: Normal Ears: Mild nonimpacted cerumen in both ears, normal tympanic membranes Mouth: There is a less than 1 cm area of erythema on the upper soft palate near the uvula.  Looks like it could be a local trauma or perhaps telangiectasia.  Patient has never noticed it before.  No other lesions in the mouth.  No vesicles.  No exudates. Neck: No adenopathy, normal thyroid Heart regular, no murmur Lungs: Unlabored, clear to auscultation throughout, no focal crackles or wheezing Abdomen: Skin, soft, nontender EXTR: Warm, well-perfused, no edema Neuro: Alert, conversational, full strength upper and lower extremities        Assessment & Plan:   Problem List Items Addressed This Visit       High   Community acquired pneumonia - Primary   Clinical suspicion for community-acquired pneumonia based on symptoms of chest discomfort, pleuritic pain, and productive cough.  No diagnosis of underlying lung disease, but at risk for COPD given tobacco use.  Will check CBC today, as well as x-ray.      Relevant Orders   CBC with Differential/Platelet   HIV Antibody (routine testing w rflx)   DG Chest 2 View   Oral lesion   In the soft palate of the mouth there is a incidental finding of a 8 x 5 mm erythematous lesion, appears flat.   No ulcerations.  Not associated with lymphadenopathy.  Patient is at increased risk of head neck malignancy due to tobacco and alcohol use.  Lesion could also be from local trauma.  Asked him to return in 1 month for recheck.  If it does not heal/resolve, will refer to ENT.        Medium    Hypertension (Chronic)   Mildly elevated blood pressure, stage I hypertension.  Treatment optional at this point.  Manage check lipids and A1c for risk factor modifications.  If he is at elevated risk for ischemic events, will  treat this mild hypertension.      Relevant Orders   Basic metabolic panel with GFR   Hemoglobin A1c   Lipid panel   Tobacco use disorder (Chronic)   Other Visit Diagnoses       Encounter for HCV screening test for low risk patient       Relevant Orders   Hepatitis C antibody       Return in about 4 weeks (around 11/13/2023).   Tyson Alias, MD

## 2023-10-17 LAB — HIV ANTIBODY (ROUTINE TESTING W REFLEX): HIV 1&2 Ab, 4th Generation: NONREACTIVE

## 2023-10-17 LAB — HEPATITIS C ANTIBODY: Hepatitis C Ab: NONREACTIVE

## 2023-10-19 ENCOUNTER — Encounter: Payer: Self-pay | Admitting: Student in an Organized Health Care Education/Training Program

## 2023-10-19 ENCOUNTER — Telehealth: Payer: Self-pay

## 2023-10-19 NOTE — Telephone Encounter (Signed)
 Great to hear. I would like to see him on the follow up appointment on 5/9 to follow up on the lesion in his mouth.

## 2023-10-19 NOTE — Telephone Encounter (Signed)
 If patient is feeling better do you still wish to have follow up?

## 2023-10-19 NOTE — Telephone Encounter (Signed)
 Called patient to verify keeping that appointment on 11/13/2023. Patient verbalized understanding.

## 2023-10-19 NOTE — Telephone Encounter (Signed)
 Copied from CRM [id number]. Topic: General - Call Back - No Documentation >> Oct 19, 2023 10:22 AM Ovid Blow wrote: Reason for CRM: patient stated he is feeling much better. Seen test results on Mychart. If Dr. Margarita Shear to schedule f/u then patient is ok with that. Please contact patient with any concerns

## 2023-11-13 ENCOUNTER — Ambulatory Visit: Admitting: Student in an Organized Health Care Education/Training Program

## 2023-11-13 ENCOUNTER — Encounter: Payer: Self-pay | Admitting: Student in an Organized Health Care Education/Training Program

## 2023-11-13 VITALS — BP 156/80 | HR 87 | Temp 97.9°F | Ht 73.0 in | Wt 161.7 lb

## 2023-11-13 DIAGNOSIS — K1370 Unspecified lesions of oral mucosa: Secondary | ICD-10-CM

## 2023-11-13 DIAGNOSIS — I10 Essential (primary) hypertension: Secondary | ICD-10-CM

## 2023-11-13 DIAGNOSIS — Z0000 Encounter for general adult medical examination without abnormal findings: Secondary | ICD-10-CM | POA: Diagnosis not present

## 2023-11-13 MED ORDER — LOSARTAN POTASSIUM 50 MG PO TABS: 50.0000 mg | ORAL_TABLET | Freq: Every day | ORAL | 1 refills | Status: DC

## 2023-11-13 NOTE — Assessment & Plan Note (Signed)
 Resolved.  I think this was a erosion, local trauma, that is now healed.

## 2023-11-13 NOTE — Progress Notes (Signed)
   Established Patient Office Visit  Subjective   Patient ID: Carl Bush, male    DOB: Mar 20, 1971  Age: 53 y.o. MRN: [id number]  Chief Complaint  Patient presents with   Follow-up    Pt here for 4 week check on concern with mouth    HPI  Carl Bush here for close follow-up of a lesion on the inside of his mouth.  While I saw him he had a cough, acute bronchitis type symptoms.  We had a chest x-ray that ruled out pneumonia.  He recovered well from that with supportive care.  Otherwise doing well, functional.  He takes care of his 36 year old mother.  He reports being functional, getting all his work done.  Reports having several beers every night, but not interfering with his work.  We talked about hypertension for a while, he is hesitant to use medications because of risk of adverse side effects.  No chest pain or shortness of breath recently.    Objective:     BP (!) 156/80   Pulse 87   Temp 97.9 F (36.6 C) (Temporal)   Ht 6\' 1"  (1.854 m)   Wt 161 lb 11.2 oz (73.3 kg)   SpO2 97%   BMI 21.33 kg/m    Physical Exam  Gen: Well-appearing Mouth: Previously seen lesion on the upper soft palate is now resolved.  There are no masses or erosions in his mouth. Neck: Normal, no adenopathy Heart: Regular, no murmur Lungs: Unlabored, clear throughout    Assessment & Plan:   Problem List Items Addressed This Visit       Medium    Hypertension (Chronic)   Chronic and worsening.  Blood pressure elevated today at 156/80.  It was mildly elevated at last visit.  I recommended medication assisted treatment with losartan 50 mg daily.  We talked about the risks and benefits of treating hypertension.  We talked about potential side effects of losartan.  Patient wants more time to consider before starting medication.  I sent it to his pharmacy and he can fill it if he would like.  If he does choose to start this medication, I asked that he come and see me 4 weeks after initiating  for potassium check.      Relevant Medications   losartan (COZAAR) 50 MG tablet     Low   RESOLVED: Oral lesion - Primary   Resolved.  I think this was a erosion, local trauma, that is now healed.        Unprioritized   Healthcare maintenance   Patient is overall minimalist about his care.  Would only like to utilize very high value interventions.  We talked about age-appropriate cancer screening and he has declined colon cancer or lung cancer screening.  We talked about vaccination, and he declines vaccination to pneumococcus.       Return in about 6 weeks (around 12/25/2023) for hypertension management.    Ether Hercules, MD

## 2023-11-13 NOTE — Assessment & Plan Note (Signed)
 Patient is overall minimalist about his care.  Would only like to utilize very high value interventions.  We talked about age-appropriate cancer screening and he has declined colon cancer or lung cancer screening.  We talked about vaccination, and he declines vaccination to pneumococcus.

## 2023-11-13 NOTE — Assessment & Plan Note (Signed)
 Chronic and worsening.  Blood pressure elevated today at 156/80.  It was mildly elevated at last visit.  I recommended medication assisted treatment with losartan 50 mg daily.  We talked about the risks and benefits of treating hypertension.  We talked about potential side effects of losartan.  Patient wants more time to consider before starting medication.  I sent it to his pharmacy and he can fill it if he would like.  If he does choose to start this medication, I asked that he come and see me 4 weeks after initiating for potassium check.

## 2024-01-07 ENCOUNTER — Emergency Department (HOSPITAL_BASED_OUTPATIENT_CLINIC_OR_DEPARTMENT_OTHER)
Admission: EM | Admit: 2024-01-07 | Discharge: 2024-01-07 | Disposition: A | Discharge status: Home / Self Care | Attending: Emergency Medicine | Admitting: Emergency Medicine

## 2024-01-07 ENCOUNTER — Other Ambulatory Visit: Payer: Self-pay

## 2024-01-07 ENCOUNTER — Other Ambulatory Visit (HOSPITAL_BASED_OUTPATIENT_CLINIC_OR_DEPARTMENT_OTHER): Payer: Self-pay

## 2024-01-07 ENCOUNTER — Encounter (HOSPITAL_BASED_OUTPATIENT_CLINIC_OR_DEPARTMENT_OTHER): Payer: Self-pay | Admitting: Emergency Medicine

## 2024-01-07 ENCOUNTER — Emergency Department (HOSPITAL_BASED_OUTPATIENT_CLINIC_OR_DEPARTMENT_OTHER)

## 2024-01-07 DIAGNOSIS — Z79899 Other long term (current) drug therapy: Secondary | ICD-10-CM | POA: Insufficient documentation

## 2024-01-07 DIAGNOSIS — S0990XA Unspecified injury of head, initial encounter: Secondary | ICD-10-CM | POA: Diagnosis present

## 2024-01-07 DIAGNOSIS — D72829 Elevated white blood cell count, unspecified: Secondary | ICD-10-CM | POA: Diagnosis not present

## 2024-01-07 DIAGNOSIS — D696 Thrombocytopenia, unspecified: Secondary | ICD-10-CM | POA: Diagnosis not present

## 2024-01-07 DIAGNOSIS — W19XXXA Unspecified fall, initial encounter: Secondary | ICD-10-CM

## 2024-01-07 DIAGNOSIS — R7401 Elevation of levels of liver transaminase levels: Secondary | ICD-10-CM | POA: Diagnosis not present

## 2024-01-07 DIAGNOSIS — I10 Essential (primary) hypertension: Secondary | ICD-10-CM | POA: Insufficient documentation

## 2024-01-07 DIAGNOSIS — W01190A Fall on same level from slipping, tripping and stumbling with subsequent striking against furniture, initial encounter: Secondary | ICD-10-CM | POA: Insufficient documentation

## 2024-01-07 DIAGNOSIS — Y92019 Unspecified place in single-family (private) house as the place of occurrence of the external cause: Secondary | ICD-10-CM | POA: Insufficient documentation

## 2024-01-07 HISTORY — DX: Other pulmonary embolism without acute cor pulmonale: I26.99

## 2024-01-07 LAB — CBC
HCT: 40.1 % (ref 39.0–52.0)
Hemoglobin: 13.7 g/dL (ref 13.0–17.0)
MCH: 32.2 pg (ref 26.0–34.0)
MCHC: 34.2 g/dL (ref 30.0–36.0)
MCV: 94.4 fL (ref 80.0–100.0)
Platelets: 89 10*3/uL — ABNORMAL LOW (ref 150–400)
RBC: 4.25 MIL/uL (ref 4.22–5.81)
RDW: 14 % (ref 11.5–15.5)
WBC: 10.8 10*3/uL — ABNORMAL HIGH (ref 4.0–10.5)
nRBC: 0 % (ref 0.0–0.2)

## 2024-01-07 LAB — ETHANOL: Alcohol, Ethyl (B): <15 mg/dL (ref <15)

## 2024-01-07 LAB — COMPREHENSIVE METABOLIC PANEL WITH GFR
ALT: 26 U/L (ref 0–44)
AST: 44 U/L — ABNORMAL HIGH (ref 15–41)
Albumin: 3.8 g/dL (ref 3.5–5.0)
Alkaline Phosphatase: 95 U/L (ref 38–126)
Anion gap: 9 (ref 5–15)
BUN: 9 mg/dL (ref 6–20)
CO2: 27 mmol/L (ref 22–32)
Calcium: 8.9 mg/dL (ref 8.9–10.3)
Chloride: 101 mmol/L (ref 98–111)
Creatinine, Ser: 0.75 mg/dL (ref 0.61–1.24)
GFR, Estimated: >60 mL/min (ref >60)
Glucose, Bld: 87 mg/dL (ref 70–99)
Potassium: 3.9 mmol/L (ref 3.5–5.1)
Sodium: 137 mmol/L (ref 135–145)
Total Bilirubin: 0.4 mg/dL (ref 0.0–1.2)
Total Protein: 6.3 g/dL — ABNORMAL LOW (ref 6.5–8.1)

## 2024-01-07 LAB — MAGNESIUM: Magnesium: 1.5 mg/dL — ABNORMAL LOW (ref 1.7–2.4)

## 2024-01-07 MED ORDER — ADULT MULTIVITAMIN W/MINERALS CH
1.0000 | ORAL_TABLET | Freq: Every day | ORAL | Status: DC
  Administered 2024-01-07: 1 via ORAL
  Filled 2024-01-07: qty 1

## 2024-01-07 MED ORDER — THIAMINE HCL 100 MG/ML IJ SOLN: 100.0000 mg | Freq: Every day | INTRAMUSCULAR | Status: DC

## 2024-01-07 MED ORDER — FOLIC ACID 1 MG PO TABS
1.0000 mg | ORAL_TABLET | Freq: Every day | ORAL | Status: DC
  Administered 2024-01-07: 1 mg via ORAL
  Filled 2024-01-07: qty 1

## 2024-01-07 MED ORDER — IOHEXOL 350 MG/ML SOLN
75.0000 mL | Freq: Once | INTRAVENOUS | Status: AC | PRN
  Administered 2024-01-07: 75 mL via INTRAVENOUS

## 2024-01-07 MED ORDER — THIAMINE HCL 100 MG PO TABS
100.0000 mg | ORAL_TABLET | Freq: Every day | ORAL | 0 refills | Status: AC
  Filled 2024-01-07: qty 20, 20d supply, fill #0

## 2024-01-07 MED ORDER — LOSARTAN POTASSIUM 50 MG PO TABS
50.0000 mg | ORAL_TABLET | Freq: Every day | ORAL | 1 refills | Status: AC
  Filled 2024-01-07: qty 30, 30d supply, fill #0

## 2024-01-07 MED ORDER — THIAMINE MONONITRATE 100 MG PO TABS
100.0000 mg | ORAL_TABLET | Freq: Every day | ORAL | Status: DC
  Administered 2024-01-07: 100 mg via ORAL
  Filled 2024-01-07: qty 1

## 2024-01-07 MED ORDER — MAGNESIUM OXIDE -MG SUPPLEMENT 400 (240 MG) MG PO TABS
400.0000 mg | ORAL_TABLET | Freq: Once | ORAL | Status: AC
  Administered 2024-01-07: 400 mg via ORAL
  Filled 2024-01-07: qty 1

## 2024-01-07 NOTE — ED Notes (Signed)
 Pt admits to drinking alcohol when he fell a week ago

## 2024-01-07 NOTE — ED Triage Notes (Signed)
 Pt caox4, ambulatory reporting approx 1 wk ago he had a mechanical fall at home and hit the back of his head on a table, denies LOC, some blurry vision in R eye. Denies pain.

## 2024-01-07 NOTE — Discharge Instructions (Signed)
 CT scan of your head appeared normal.  No evidence of brain bleed, arterial dissection, obvious stroke.  Will begin you on thiamine  supplement.  Will recommend follow-up with neurology in the outpatient setting for reassessment of your symptoms.  Please do not hesitate to return to emergency department if the worrisome signs and symptoms we discussed become apparent.

## 2024-01-07 NOTE — ED Notes (Signed)
 Pt c/o that vision in right eye has been different since fall/ provider made aware

## 2024-01-07 NOTE — ED Notes (Signed)
Lav redraw.

## 2024-01-07 NOTE — ED Provider Notes (Signed)
 Carl Bush   CSN: [id number] Arrival date & time: 01/07/24  1139     Patient presents with: Carl Bush is a 53 y.o. male.    Fall   53 year old male presents emergency department with complaints of fall, feelings of imbalance, blurry vision.  Patient reports fall about a week ago.  States that he fell on balance and fell hitting the back of his head on a coffee table.  Denies LOC, blood thinner use.  States that he is a chronic alcoholic drinking a sixpack of beer a day at least but had more to drink prior to fall which he thinks contributed to his feelings of imbalance.  States that ever since the fall, has had continued feelings of imbalance.  Denies any recurrent falls but states that he just does not feel right walking.  Denies any room spinning type sensation, feelings of if he is going to pass out.  Patient will also reports blurry vision in his eyes.  States that this is an the lateral peripheral aspects of both of his eyes most so.  Does report decreased vision in his right side at baseline but states that the peripheral version of his right eye is worse than normal and more noticeable than his left eye.  Denies any weakness/sensory deficits in upper lower extremities, slurred speech, facial droop.  Denies any recurrent falls.  Denies any pain elsewhere from fall.  Denies any ocular pain.  Denies seeing any flashes or floaters in his eyes.  Past medical history significant for atrial flutter not on anticoagulation, PE, alcohol dependence, hypertension  Prior to Admission medications   Medication Sig Start Date End Date Taking? Authorizing Provider  losartan  (COZAAR ) 50 MG tablet Take 1 tablet (50 mg total) by mouth daily. 11/13/23   Jerrell Cleatus Ned, MD    Allergies: Patient has no known allergies.    Review of Systems  All other systems reviewed and are negative.   Updated Vital Signs BP (!) 175/107    Pulse 92   Temp 98.7 F (37.1 C) (Oral)   Resp 16   Ht 6' 1 (1.854 m)   Wt 74.8 kg   SpO2 98%   BMI 21.77 kg/m   Physical Exam Vitals and nursing Bush reviewed.  Constitutional:      General: He is not in acute distress.    Appearance: He is well-developed.  HENT:     Head: Normocephalic and atraumatic.     Right Ear: External ear normal.     Left Ear: Tympanic membrane, ear canal and external ear normal.     Ears:     Comments: Right-sided only partial visualization of tympanic membrane superior aspect which appears to be intact and not erythematous without obvious effusion.  Rest of auditory canal obscured by cerumen. Eyes:     Extraocular Movements: Extraocular movements intact.     Right eye: Normal extraocular motion and no nystagmus.     Left eye: Normal extraocular motion and no nystagmus.     Conjunctiva/sclera: Conjunctivae normal.     Right eye: Right conjunctiva is not injected. No chemosis, exudate or hemorrhage.    Left eye: Left conjunctiva is not injected. No chemosis, exudate or hemorrhage.    Comments: Visual field confrontation performed about 18 inches away from patient's face intact in all 4 quadrants bilateral eye.  Cardiovascular:     Rate and Rhythm: Normal rate and regular rhythm.  Heart sounds: No murmur heard. Pulmonary:     Effort: Pulmonary effort is normal. No respiratory distress.     Breath sounds: Normal breath sounds.  Abdominal:     Palpations: Abdomen is soft.     Tenderness: There is no abdominal tenderness.  Musculoskeletal:        General: No swelling.     Cervical back: Neck supple.  Skin:    General: Skin is warm and dry.     Capillary Refill: Capillary refill takes less than 2 seconds.  Neurological:     Mental Status: He is alert.     Comments: Alert and oriented to self, place, time and event.   Speech is fluent, clear without dysarthria or dysphasia.   Strength 5/5 in upper/lower extremities   Sensation intact in  upper/lower extremities   Patient ambulates with a straight line without obvious gait deviation and does so unassisted. Negative Romberg. No pronator drift.  Normal finger-to-nose and feet tapping.  CN I not tested  CN II not tested CN III, IV, VI PERRLA and EOMs intact bilaterally  CN V Intact sensation to sharp and light touch to the face  CN VII facial movements symmetric  CN VIII not tested  CN IX, X no uvula deviation, symmetric rise of soft palate  CN XI 5/5 SCM and trapezius strength bilaterally  CN XII Midline tongue protrusion, symmetric L/R movements     Psychiatric:        Mood and Affect: Mood normal.     (all labs ordered are listed, but only abnormal results are displayed) Labs Reviewed - No data to display  EKG: None  Radiology: No results found.   Procedures   Medications Ordered in the ED - No data to display                                  Medical Decision Making Amount and/or Complexity of Data Reviewed Labs: ordered. Radiology: ordered.  Risk OTC drugs. Prescription drug management.   This patient presents to the ED for concern of fall, blurry vision, feelings of imbalance, this involves an extensive number of treatment options, and is a complaint that carries with it a high risk of complications and morbidity.  The differential diagnosis includes CVA, BPPV, labyrinthitis, vertebrobasilar insufficiency, Warnicke's encephalopathy, electrolyte derangement, drug ingestion/withdrawal, arterial dissection, other   Co morbidities that complicate the patient evaluation  See HPI   Additional history obtained:  Additional history obtained from EMR External records from outside source obtained and reviewed including hospital records   Lab Tests:  I Ordered, and personally interpreted labs.  The pertinent results include: Leukocytosis of 10.8.  No evidence of anemia.  Thrombocytopenia of 89 which is new from prior labs performed 2 months ago.   No Electra abnormalities.  Slight AST elevation of 44 otherwise, normal ALT.  No renal dysfunction.  Ethanol negative.   Imaging Studies ordered:  I ordered imaging studies including CTA head and neck I independently visualized and interpreted imaging which showed no large vessel occlusion.  No evidence of acute intracranial abnormality.  No high-grade stenosis aneurysm or dissection arteries head and neck.  Emphysema. I agree with the radiologist interpretation  Cardiac Monitoring: / EKG:  The patient was maintained on a cardiac monitor.  I personally viewed and interpreted the cardiac monitored which showed an underlying rhythm of: Ennis rhythm   Consultations Obtained:  I requested consultation  with attending Dr. Charlyn who is in agreement treatment plan going forward   Problem List / ED Course / Critical interventions / Medication management  Fall, head injury I ordered medication including folic acid , multivitamin, thiamine , magnesium  oxide   Reevaluation of the patient after these medicines showed that the patient improved I have reviewed the patients home medicines and have made adjustments as needed   Social Determinants of Health:  Alcohol abuse.  Chronic cigarette use.   Test / Admission - Considered:  Fall, head injury Vitals signs significant for hypertension. Otherwise within normal range and stable throughout visit. Imaging studies significant for: See above 53 year old male presents emergency department with complaints of fall, feelings of imbalance, blurry vision.  Patient reports fall about a week ago.  States that he fell on balance and fell hitting the back of his head on a coffee table.  Denies LOC, blood thinner use.  States that he is a chronic alcoholic drinking a sixpack of beer a day at least but had more to drink prior to fall which he thinks contributed to his feelings of imbalance.  States that ever since the fall, has had continued feelings of  imbalance.  Denies any recurrent falls but states that he just does not feel right walking.  Denies any room spinning type sensation, feelings of if he is going to pass out.  Patient will also reports blurry vision in his eyes.  States that this is an the lateral peripheral aspects of both of his eyes most so.  Does report decreased vision in his right side at baseline but states that the peripheral version of his right eye is worse than normal and more noticeable than his left eye.  Denies any weakness/sensory deficits in upper lower extremities, slurred speech, facial droop.  Denies any recurrent falls.  Denies any pain elsewhere from fall.  Denies any ocular pain.  Denies seeing any flashes or floaters in his eyes. On exam, scabbing of abrasions appreciated occipital region of head.  Nonfocal neurologic testing including cerebellar testing.  Patient with normal visual field with confrontation patient without any ophthalmoplegia, nystagmus, confusion, ataxia; low suspicion for Warnicke's encephalopathy.  Symptoms do not seem consistent with peripheral/central vertigo CT imaging was obtained at patient's head and neck angio which were negative for any acute abnormality.  Patient reassured by findings.  Suspect the patient may have suffered concussion type episode.  Regarding patient's feelings of imbalance despite having normal neurologic exam including cerebellar testing, will recommend follow-up with neurology in the outpatient setting.  Considered emergent neuro MR imaging but given the patient's symptoms have been going on for a week, began after head injury occurred with negative CTA head and neck, consulted with attending physician who agreed with outpatient follow-up for reevaluation of symptoms.  Given patient's history of chronic alcohol use, will begin patient on thiamine  supplement.  Will also represcribe patient's Cozaar  as he was post to be on it by primary care but never picked it up at the pharmacy.   Treatment plan discussed with patient and he is understanding was agreeable to said plan.  Patient will well-appearing, afebrile in no acute distress. Worrisome signs and symptoms were discussed with the patient, and the patient acknowledged understanding to return to the ED if noticed. Patient was stable upon discharge.       Final diagnoses:  None    ED Discharge Orders     None          Silver Wonda LABOR,  PA 01/07/24 1536    Charlyn Sora, MD 01/08/24 [id number]

## 2024-01-29 ENCOUNTER — Ambulatory Visit: Admitting: Student in an Organized Health Care Education/Training Program

## 2024-01-29 VITALS — BP 154/97 | HR 91 | Ht 73.0 in | Wt 148.8 lb

## 2024-01-29 DIAGNOSIS — I10 Essential (primary) hypertension: Secondary | ICD-10-CM

## 2024-01-29 DIAGNOSIS — M79605 Pain in left leg: Secondary | ICD-10-CM | POA: Diagnosis not present

## 2024-01-29 NOTE — Progress Notes (Signed)
 Acute Office Visit  Subjective:     Patient ID: Carl Bush, male    DOB: [birth year], 53 y.o.   MRN: [id number]  Chief Complaint  Patient presents with   Leg Pain    Possibly from taking Cozar. Started medication 7/5. Just the left leg.    HPI  Discussed the use of AI scribe software for clinical note transcription with the patient, who gave verbal consent to proceed.  History of Present Illness Carl Bush is a 53 year old male with a history of pulmonary embolism and atrial fibrillation who presents with left leg pain.  He has been experiencing pain in his left leg, starting in the calf and extending up the back of the leg, for a couple of weeks. The pain is constant, non-throbbing, and does not radiate to the foot. It does not change with walking or standing and is absent at night, beginning about an hour after he gets up in the morning. He reports no burning pain in the leg. He does not report pain in the knee or hip.  He has a history of pulmonary embolism in 2019, for which he was on anticoagulant therapy for one year. He underwent cardioversion and ablation for atrial fibrillation. He is concerned about the possibility of deep vein thrombosis due to his past history of blood clots, although he has not experienced any leg swelling.  He started taking Cozaar  on July 5th and takes it every morning. He questions whether the leg pain could be a side effect of the medication. He also has a history of lower back arthritis, which has not changed and does not seem related to his current leg pain.  He has a history of tobacco use, which may contribute to his risk factors for vascular issues.      Objective:    BP (!) 154/97 (BP Location: Right Arm, Patient Position: Sitting, Cuff Size: Normal)   Pulse 91   Ht 6' 1 (1.854 m)   Wt 148 lb 12.8 oz (67.5 kg)   SpO2 100%   BMI 19.63 kg/m    Physical Exam  Gen: Well-appearing man MSK: No lower extremity edema in either leg.   Left leg has no pain or tenderness at the calf or the mid thigh.  He has good range of motion of the knees with no joint effusion.  Normal range of motion of the left hip. Skin: Skin looks normal, no rash or wounds.     Assessment & Plan:    Problem List Items Addressed This Visit       Medium    Hypertension (Chronic)   He has been on Cozaar  since July 5th and is compliant. The leg pain is not linked to Cozaar . A potassium check is planned to ensure electrolyte balance. Continue Cozaar  and monitor blood pressure, adjusting as needed. Follow-up in 4 weeks.  Blood pressure not yet at goal, but will not make an adjustment right now because of acute issues with his leg discomfort.      Relevant Orders   Basic metabolic panel with GFR     Unprioritized   Left leg pain - Primary   He experiences persistent calf pain without swelling or pulse changes, with a low risk for DVT. The differential diagnosis includes claudication, DVT, or referred pain. A D-dimer test is ordered to exclude DVT. If elevated, a leg ultrasound will be performed. Potassium levels will also be checked.  Differential diagnosis also includes claudication due to arterial  disease.  He is at risk for this because of tobacco use.  He has palpable DP and PT pulses which makes this a little less likely.  I think if we rule out DVT would like to do ABIs.  Differential also includes referred pain from osteoarthritis.  He has known lumbar disease which may be the culprit.  Could also consider imaging the left hip and need to rule out arthritis in those joints, though the exams are reassuring there.      Relevant Orders   D-Dimer, Quantitative   CK    Return in about 4 weeks (around 02/26/2024).  Cleatus Debby Specking, MD

## 2024-01-29 NOTE — Assessment & Plan Note (Signed)
 He has been on Cozaar  since July 5th and is compliant. The leg pain is not linked to Cozaar . A potassium check is planned to ensure electrolyte balance. Continue Cozaar  and monitor blood pressure, adjusting as needed. Follow-up in 4 weeks.  Blood pressure not yet at goal, but will not make an adjustment right now because of acute issues with his leg discomfort.

## 2024-01-29 NOTE — Patient Instructions (Signed)
  VISIT SUMMARY: Today, you came in with concerns about persistent pain in your left leg. We discussed your symptoms, medical history, and potential causes of the pain. We also reviewed your current medication and its possible side effects.  YOUR PLAN: -LEFT LEG PAIN: You have been experiencing pain in your left calf without swelling or changes in pulse. Given your history, we want to rule out deep vein thrombosis (DVT), which is a blood clot in a deep vein. We will start with a D-dimer test, and if the results are elevated, we will proceed with a leg ultrasound. We will also check your potassium levels.  -HYPERTENSION: You have been taking Cozaar  for your high blood pressure since July 5th. Your leg pain does not appear to be a side effect of this medication. We will check your potassium levels to ensure they are balanced. Please continue taking Cozaar  and monitor your blood pressure. We will reassess your condition in 4 weeks.  INSTRUCTIONS: Please go to the lab for your D-dimer and potassium tests. Continue taking Cozaar  as prescribed and monitor your blood pressure regularly. Follow up in 4 weeks for a reassessment of your condition.

## 2024-01-29 NOTE — Assessment & Plan Note (Signed)
 He experiences persistent calf pain without swelling or pulse changes, with a low risk for DVT. The differential diagnosis includes claudication, DVT, or referred pain. A D-dimer test is ordered to exclude DVT. If elevated, a leg ultrasound will be performed. Potassium levels will also be checked.  Differential diagnosis also includes claudication due to arterial disease.  He is at risk for this because of tobacco use.  He has palpable DP and PT pulses which makes this a little less likely.  I think if we rule out DVT would like to do ABIs.  Differential also includes referred pain from osteoarthritis.  He has known lumbar disease which may be the culprit.  Could also consider imaging the left hip and need to rule out arthritis in those joints, though the exams are reassuring there.

## 2024-01-30 LAB — BASIC METABOLIC PANEL WITH GFR
BUN: 12 mg/dL (ref 7–25)
CO2: 26 mmol/L (ref 20–32)
Calcium: 10 mg/dL (ref 8.6–10.3)
Chloride: 101 mmol/L (ref 98–110)
Creat: 0.71 mg/dL (ref 0.70–1.30)
Glucose, Bld: 84 mg/dL (ref 65–99)
Potassium: 5 mmol/L (ref 3.5–5.3)
Sodium: 136 mmol/L (ref 135–146)
eGFR: 111 mL/min/1.73m2 (ref >=60)

## 2024-01-30 LAB — CK: Total CK: 95 U/L (ref 23–325)

## 2024-01-30 LAB — D-DIMER, QUANTITATIVE: D-Dimer, Quant: 0.27 ug{FEU}/mL (ref <0.50)

## 2024-02-01 ENCOUNTER — Ambulatory Visit: Payer: Self-pay | Admitting: Student in an Organized Health Care Education/Training Program

## 2024-02-29 ENCOUNTER — Ambulatory Visit: Admitting: Student in an Organized Health Care Education/Training Program

## 2024-03-17 ENCOUNTER — Ambulatory Visit: Admitting: Student in an Organized Health Care Education/Training Program

## 2024-04-29 ENCOUNTER — Telehealth: Payer: Self-pay | Admitting: Student in an Organized Health Care Education/Training Program

## 2024-04-29 NOTE — Telephone Encounter (Signed)
 Patient is requesting to have a Shingles vaccine      Needs provider approval/order prior to scheduling? Yes  Message sent to clinic pool for authorization/order? Yes  Please call patient once ordered to schedule office visit or nurse visit at:  Patient is scheduled to come in on 10/31 and is requesting vaccine during visit

## 2024-05-06 ENCOUNTER — Encounter: Payer: Self-pay | Admitting: Student in an Organized Health Care Education/Training Program

## 2024-05-06 ENCOUNTER — Ambulatory Visit: Admitting: Student in an Organized Health Care Education/Training Program

## 2024-05-06 VITALS — BP 173/101 | HR 78 | Wt 158.0 lb

## 2024-05-06 DIAGNOSIS — F17200 Nicotine dependence, unspecified, uncomplicated: Secondary | ICD-10-CM | POA: Diagnosis not present

## 2024-05-06 DIAGNOSIS — Z23 Encounter for immunization: Secondary | ICD-10-CM | POA: Diagnosis not present

## 2024-05-06 DIAGNOSIS — I10 Essential (primary) hypertension: Secondary | ICD-10-CM

## 2024-05-06 DIAGNOSIS — J439 Emphysema, unspecified: Secondary | ICD-10-CM | POA: Insufficient documentation

## 2024-05-06 DIAGNOSIS — Z0000 Encounter for general adult medical examination without abnormal findings: Secondary | ICD-10-CM

## 2024-05-06 DIAGNOSIS — I2510 Atherosclerotic heart disease of native coronary artery without angina pectoris: Secondary | ICD-10-CM | POA: Insufficient documentation

## 2024-05-06 DIAGNOSIS — F1090 Alcohol use, unspecified, uncomplicated: Secondary | ICD-10-CM

## 2024-05-06 NOTE — Assessment & Plan Note (Signed)
 Blood pressure is elevated at 173/110 mmHg.  Patient stopped using losartan  due to liver enzyme concerns that he read about, likely related to alcohol use, the risk of liver issues from losartan  is low. He is at high cardiovascular risk with current blood pressure.  I recommended restarting losartan  and offered to check liver enzymes after resuming. Schedule a follow-up in three months to reassess blood pressure and liver enzymes.

## 2024-05-06 NOTE — Assessment & Plan Note (Signed)
 He consumes six drinks per day, likely contributing to elevated liver enzymes. Support for reducing alcohol intake was offered if desired.  I recommended continuing vitamin support with daily folic acid , thiamine , and a multivitamin.

## 2024-05-06 NOTE — Progress Notes (Signed)
 Established Patient Office Visit  Patient ID: Carl Bush, male    DOB: [birth year]  Age: 53 y.o. MRN: [id number] PCP: Jerrell Cleatus Ned, MD  Chief Complaint  Patient presents with   Medical Management of Chronic Issues    4 week follow up    Subjective:     HPI  Discussed the use of AI scribe software for clinical note transcription with the patient, who gave verbal consent to proceed.  History of Present Illness Carl Bush is a 53 year old male with hypertension who presents with concerns about his blood pressure medication and for a shingles vaccine.  He stopped taking his blood pressure medication, Cozaar  (losartan ), last Friday due to concerns about potential liver enzyme elevation after reading online about its effects on the liver. He has a history of elevated liver enzymes, specifically AST, which he attributes to alcohol use. He had been taking Cozaar  every morning and marking it on his calendar, but his last dose was on Friday. He has a refill available at [facility].  He is here to receive a shingles vaccine, motivated by his sister's recent illness with shingles. He inquires about the side effects of the vaccine, particularly regarding soreness, and is reassured that he will likely be able to perform physical activities like blowing leaves the next day.  He drinks about a six-pack of beer daily and smokes a pack of cigarettes a day. He is currently living in a rental property near his mother's new home in [place], having recently helped her move from her long-term residence, which was a stressful process. He is not getting enough exercise as his exercise bike is in storage, and he is still in transition. He eats one meal a day, typically dinner, and has been taking thiamine  (B1) as recommended by the emergency department after a previous visit. He plans to continue with B1 and add a multivitamin with folic acid  due to his limited meal intake.  No chest pain or  shortness of breath. His leg pain has resolved and has not returned. He notes a weight increase of 10 pounds since July, which he views positively. He experiences dry skin and cold hands during the winter, which he attributes to poor circulation.    Objective:     BP (!) 173/101   Pulse 78   Wt 158 lb (71.7 kg)   SpO2 100%   BMI 20.85 kg/m   Physical Exam  Gen: Well-appearing man Mouth: No oral lesions Neck: Normal thyroid, no nodules Heart: Regular, no murmur Lungs: Unlabored, clear throughout    Assessment & Plan:   Problem List Items Addressed This Visit       High   Tobacco use disorder (Chronic)   He smokes one pack per day. Support for reducing tobacco use was offered if desired.      Alcohol use disorder (Chronic)   He consumes six drinks per day, likely contributing to elevated liver enzymes. Support for reducing alcohol intake was offered if desired.  I recommended continuing vitamin support with daily folic acid , thiamine , and a multivitamin.      Hypertension (Chronic)   Blood pressure is elevated at 173/110 mmHg.  Patient stopped using losartan  due to liver enzyme concerns that he read about, likely related to alcohol use, the risk of liver issues from losartan  is low. He is at high cardiovascular risk with current blood pressure.  I recommended restarting losartan  and offered to check liver enzymes after resuming. Schedule a follow-up in  three months to reassess blood pressure and liver enzymes.        Low   Healthcare maintenance (Chronic)   The benefits of the shingles vaccine were discussed given his age and family history, along with potential soreness post-vaccination. The shingles vaccine was administered.      Other Visit Diagnoses       Need for shingles vaccine    -  Primary   Relevant Orders   Varicella-zoster vaccine IM       Return in about 3 months (around 08/06/2024).    Cleatus Debby Specking, MD [facility] at  [facility]

## 2024-05-06 NOTE — Patient Instructions (Signed)
  VISIT SUMMARY: Today, we discussed your concerns about your blood pressure medication and administered a shingles vaccine. We also reviewed your alcohol and tobacco use, nutritional intake, and overall health maintenance.  YOUR PLAN: -HYPERTENSION: Hypertension means high blood pressure. Your blood pressure was elevated today at 173/110 mmHg. You should restart taking your blood pressure medication, losartan , and we will check your liver enzymes after you resume it. Please schedule a follow-up appointment in three months to reassess your blood pressure and liver enzymes.  -ALCOHOL ABUSE: Alcohol abuse refers to the excessive consumption of alcohol. You are drinking about six beers a day, which may be contributing to your elevated liver enzymes. Support for reducing your alcohol intake is available if you wish to pursue it.  -TOBACCO USE: Tobacco use refers to smoking cigarettes. You are currently smoking one pack per day. Support for reducing your tobacco use is available if you wish to pursue it.  -NUTRITIONAL DEFICIENCY RISK: Nutritional deficiency risk means you are at risk of not getting enough essential nutrients. You are eating only one meal a day, which can lead to deficiencies. Continue taking thiamine  as recommended and start a multivitamin with folic acid  to help address this.  -SHINGLES VACCINE: The shingles vaccine helps protect against shingles, a painful rash caused by the varicella-zoster virus. Given your age and family history, it is beneficial for you to receive this vaccine. You may experience some soreness at the injection site, but you should still be able to perform physical activities like blowing leaves the next day.  INSTRUCTIONS: Please restart taking your blood pressure medication, losartan , and schedule a follow-up appointment in three months to reassess your blood pressure and liver enzymes. Continue taking thiamine  and start a multivitamin with folic acid . If you need  support to reduce your alcohol or tobacco use, please let us  know.

## 2024-05-06 NOTE — Assessment & Plan Note (Signed)
 The benefits of the shingles vaccine were discussed given his age and family history, along with potential soreness post-vaccination. The shingles vaccine was administered.

## 2024-05-06 NOTE — Assessment & Plan Note (Signed)
 He smokes one pack per day. Support for reducing tobacco use was offered if desired.

## 2024-08-02 ENCOUNTER — Telehealth: Payer: Self-pay

## 2024-08-02 DIAGNOSIS — Z23 Encounter for immunization: Secondary | ICD-10-CM

## 2024-08-02 NOTE — Addendum Note (Signed)
 Addended by: Demeisha Geraghty K on: 08/02/2024 01:48 PM   Modules accepted: Orders

## 2024-08-02 NOTE — Telephone Encounter (Signed)
 Patient called back and would like to forego appt with provider for now but would still like to get shingles vaccine. Okay to order shingles vaccine for nurse visit?

## 2024-08-02 NOTE — Telephone Encounter (Signed)
 Copied from CRM [id number]. Topic: Appointments - Scheduling Inquiry for Clinic >> Aug 02, 2024 11:59 AM Chasity T wrote: Reason for CRM: Patient is calling to schedule the shingles vaccine for Jan 30th. He did not want to see the provider anymore just wanted to get the vaccine. He also prefers afternoon appointments.

## 2024-08-02 NOTE — Telephone Encounter (Signed)
 Yes, that is fine, he can have shingles vaccine in a nurse visit.  Thank you.

## 2024-08-02 NOTE — Telephone Encounter (Signed)
 Called patient back and got him scheduled, shingles vaccine has been ordered for future

## 2024-08-02 NOTE — Telephone Encounter (Signed)
 Called to make follow up appt with Dr last OV was in October and had a note to follow up Return in about 3 months (around 08/06/2024) patient cannot get shingles vaccine without seeing provider

## 2024-08-05 ENCOUNTER — Ambulatory Visit (INDEPENDENT_AMBULATORY_CARE_PROVIDER_SITE_OTHER)

## 2024-08-05 ENCOUNTER — Ambulatory Visit: Admitting: Student in an Organized Health Care Education/Training Program

## 2024-08-05 DIAGNOSIS — Z23 Encounter for immunization: Secondary | ICD-10-CM

## 2024-08-05 NOTE — Progress Notes (Signed)
 Patient is in office today for a nurse visit for Immunization. Patient Injection was given in the  Left deltoid. Patient tolerated injection well. Patient tolerated 2nd shingles vaccine well
